# Patient Record
Sex: Male | Born: 2000 | Race: Black or African American | Hispanic: No | Marital: Single | Smoking: Former smoker
Health system: Southern US, Community
[De-identification: ages and names within clinical notes are randomized; demographics above are authoritative.]

## PROBLEM LIST (undated history)

## (undated) DIAGNOSIS — I10 Essential (primary) hypertension: Secondary | ICD-10-CM

## (undated) DIAGNOSIS — K219 Gastro-esophageal reflux disease without esophagitis: Secondary | ICD-10-CM

## (undated) HISTORY — PX: EYE SURGERY: SHX253

---
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
## 2018-10-10 ENCOUNTER — Emergency Department (HOSPITAL_COMMUNITY): Payer: Medicaid Other

## 2018-10-10 ENCOUNTER — Emergency Department (HOSPITAL_COMMUNITY)
Admission: EM | Admit: 2018-10-10 | Discharge: 2018-10-14 | Disposition: A | Discharge status: Other Acute Inpatient Hospital | Payer: Medicaid Other | Attending: Emergency Medicine | Admitting: Emergency Medicine

## 2018-10-10 ENCOUNTER — Encounter (HOSPITAL_COMMUNITY): Payer: Self-pay | Admitting: Emergency Medicine

## 2018-10-10 DIAGNOSIS — F23 Brief psychotic disorder: Secondary | ICD-10-CM | POA: Diagnosis not present

## 2018-10-10 DIAGNOSIS — Z046 Encounter for general psychiatric examination, requested by authority: Secondary | ICD-10-CM | POA: Insufficient documentation

## 2018-10-10 DIAGNOSIS — R4182 Altered mental status, unspecified: Secondary | ICD-10-CM | POA: Diagnosis present

## 2018-10-10 DIAGNOSIS — F323 Major depressive disorder, single episode, severe with psychotic features: Secondary | ICD-10-CM | POA: Diagnosis not present

## 2018-10-10 DIAGNOSIS — I10 Essential (primary) hypertension: Secondary | ICD-10-CM | POA: Insufficient documentation

## 2018-10-10 HISTORY — DX: Gastro-esophageal reflux disease without esophagitis: K21.9

## 2018-10-10 HISTORY — DX: Essential (primary) hypertension: I10

## 2018-10-10 LAB — CBC WITH DIFFERENTIAL/PLATELET
Abs Immature Granulocytes: 0.04 10*3/uL (ref 0.00–0.07)
Basophils Absolute: 0.1 10*3/uL (ref 0.0–0.1)
Basophils Relative: 1 %
Eosinophils Absolute: 0 10*3/uL (ref 0.0–0.5)
Eosinophils Relative: 0 %
HCT: 40.2 % (ref 39.0–52.0)
Hemoglobin: 12.9 g/dL — ABNORMAL LOW (ref 13.0–17.0)
Immature Granulocytes: 0 %
LYMPHS PCT: 17 %
Lymphs Abs: 1.9 10*3/uL (ref 0.7–4.0)
MCH: 29.9 pg (ref 26.0–34.0)
MCHC: 32.1 g/dL (ref 30.0–36.0)
MCV: 93.1 fL (ref 80.0–100.0)
Monocytes Absolute: 1 10*3/uL (ref 0.1–1.0)
Monocytes Relative: 9 %
NEUTROS PCT: 73 %
Neutro Abs: 8.1 10*3/uL — ABNORMAL HIGH (ref 1.7–7.7)
Platelets: 276 10*3/uL (ref 150–400)
RBC: 4.32 MIL/uL (ref 4.22–5.81)
RDW: 12.3 % (ref 11.5–15.5)
WBC: 11.1 10*3/uL — ABNORMAL HIGH (ref 4.0–10.5)
nRBC: 0 % (ref 0.0–0.2)

## 2018-10-10 LAB — COMPREHENSIVE METABOLIC PANEL
ALT: 20 U/L (ref 0–44)
AST: 26 U/L (ref 15–41)
Albumin: 4 g/dL (ref 3.5–5.0)
Alkaline Phosphatase: 48 U/L (ref 38–126)
Anion gap: 11 (ref 5–15)
BUN: 5 mg/dL — ABNORMAL LOW (ref 6–20)
CO2: 25 mmol/L (ref 22–32)
Calcium: 9.5 mg/dL (ref 8.9–10.3)
Chloride: 103 mmol/L (ref 98–111)
Creatinine, Ser: 0.82 mg/dL (ref 0.61–1.24)
GFR calc non Af Amer: >60 mL/min (ref >60)
Glucose, Bld: 98 mg/dL (ref 70–99)
Potassium: 3.8 mmol/L (ref 3.5–5.1)
Sodium: 139 mmol/L (ref 135–145)
Total Bilirubin: 0.8 mg/dL (ref 0.3–1.2)
Total Protein: 6.5 g/dL (ref 6.5–8.1)

## 2018-10-10 LAB — POCT I-STAT EG7
Acid-Base Excess: 3 mmol/L — ABNORMAL HIGH (ref 0.0–2.0)
Bicarbonate: 28.6 mmol/L — ABNORMAL HIGH (ref 20.0–28.0)
Calcium, Ion: 1.29 mmol/L (ref 1.15–1.40)
HCT: 39 % (ref 39.0–52.0)
HEMOGLOBIN: 13.3 g/dL (ref 13.0–17.0)
O2 Saturation: 75 %
Potassium: 3.7 mmol/L (ref 3.5–5.1)
Sodium: 139 mmol/L (ref 135–145)
TCO2: 30 mmol/L (ref 22–32)
pCO2, Ven: 48.7 mmHg (ref 44.0–60.0)
pH, Ven: 7.377 (ref 7.250–7.430)
pO2, Ven: 42 mmHg (ref 32.0–45.0)

## 2018-10-10 LAB — RAPID URINE DRUG SCREEN, HOSP PERFORMED
Amphetamines: NOT DETECTED
Barbiturates: NOT DETECTED
Benzodiazepines: NOT DETECTED
Cocaine: NOT DETECTED
Opiates: NOT DETECTED
Tetrahydrocannabinol: NOT DETECTED

## 2018-10-10 LAB — MAGNESIUM: MAGNESIUM: 1.8 mg/dL (ref 1.7–2.4)

## 2018-10-10 LAB — URINALYSIS, COMPLETE (UACMP) WITH MICROSCOPIC
Bacteria, UA: NONE SEEN
Bilirubin Urine: NEGATIVE
Glucose, UA: NEGATIVE mg/dL
Hgb urine dipstick: NEGATIVE
KETONES UR: NEGATIVE mg/dL
Leukocytes,Ua: NEGATIVE
Nitrite: NEGATIVE
Protein, ur: NEGATIVE mg/dL
Specific Gravity, Urine: 1.009 (ref 1.005–1.030)
pH: 7 (ref 5.0–8.0)

## 2018-10-10 LAB — ETHANOL: Alcohol, Ethyl (B): <10 mg/dL (ref <10)

## 2018-10-10 LAB — CBG MONITORING, ED: Glucose-Capillary: 110 mg/dL — ABNORMAL HIGH (ref 70–99)

## 2018-10-10 LAB — I-STAT CREATININE, ED: Creatinine, Ser: 0.8 mg/dL (ref 0.61–1.24)

## 2018-10-10 MED ORDER — LORAZEPAM 2 MG/ML IJ SOLN
4.0000 mg | INTRAMUSCULAR | Status: DC | PRN
  Administered 2018-10-12: 4 mg via INTRAVENOUS
  Filled 2018-10-10 (×4): qty 2

## 2018-10-10 MED ORDER — HALOPERIDOL LACTATE 5 MG/ML IJ SOLN
5.0000 mg | Freq: Once | INTRAMUSCULAR | Status: AC
  Administered 2018-10-10: 5 mg via INTRAMUSCULAR

## 2018-10-10 MED ORDER — LACTATED RINGERS IV BOLUS
1000.0000 mL | Freq: Once | INTRAVENOUS | Status: AC
  Administered 2018-10-10: 1000 mL via INTRAVENOUS

## 2018-10-10 MED ORDER — ONDANSETRON HCL 4 MG PO TABS: 4.0000 mg | ORAL_TABLET | Freq: Three times a day (TID) | ORAL | Status: DC | PRN

## 2018-10-10 MED ORDER — HALOPERIDOL LACTATE 5 MG/ML IJ SOLN
INTRAMUSCULAR | Status: AC
  Administered 2018-10-10: 5 mg via INTRAMUSCULAR
  Filled 2018-10-10: qty 1

## 2018-10-10 MED ORDER — IBUPROFEN 400 MG PO TABS: 600.0000 mg | ORAL_TABLET | Freq: Three times a day (TID) | ORAL | Status: DC | PRN

## 2018-10-10 MED ORDER — NICOTINE 21 MG/24HR TD PT24: 21.0000 mg | MEDICATED_PATCH | Freq: Every day | TRANSDERMAL | Status: DC

## 2018-10-10 MED ORDER — ZOLPIDEM TARTRATE 5 MG PO TABS: 5.0000 mg | ORAL_TABLET | Freq: Every evening | ORAL | Status: DC | PRN

## 2018-10-10 MED ORDER — DIPHENHYDRAMINE HCL 50 MG/ML IJ SOLN
50.0000 mg | Freq: Once | INTRAMUSCULAR | Status: AC
  Administered 2018-10-10: 50 mg via INTRAMUSCULAR
  Filled 2018-10-10: qty 1

## 2018-10-10 MED ORDER — LORAZEPAM 1 MG PO TABS
1.0000 mg | ORAL_TABLET | ORAL | Status: AC | PRN
  Administered 2018-10-12: 1 mg via ORAL
  Filled 2018-10-10 (×3): qty 1

## 2018-10-10 MED ORDER — ALUM & MAG HYDROXIDE-SIMETH 200-200-20 MG/5ML PO SUSP: 30.0000 mL | Freq: Four times a day (QID) | ORAL | Status: DC | PRN

## 2018-10-10 MED ORDER — OLANZAPINE 5 MG PO TBDP
5.0000 mg | ORAL_TABLET | Freq: Three times a day (TID) | ORAL | Status: DC | PRN
  Filled 2018-10-10 (×2): qty 1

## 2018-10-10 MED ORDER — LORAZEPAM 2 MG/ML IJ SOLN
2.0000 mg | Freq: Once | INTRAMUSCULAR | Status: AC
  Administered 2018-10-10: 2 mg via INTRAMUSCULAR
  Filled 2018-10-10: qty 1

## 2018-10-10 NOTE — ED Notes (Signed)
Return from CT.  Pt resting quietly at this time  Family at bedside

## 2018-10-10 NOTE — ED Notes (Signed)
Pt to CT at this time.

## 2018-10-10 NOTE — ED Provider Notes (Signed)
[facility] EMERGENCY DEPARTMENT Provider Note   CSN: [id number] Arrival date & time: 10/10/18  1450  LEVEL 5 CAVEAT - ALTERED MENTAL STATUS   History   Chief Complaint Chief Complaint  Patient presents with  . Aggressive Behavior    HPI Jon Branch is a 18 y.o. male.     HPI  18 year old male brought in by parents after shaking and altered mental status. History is limited by patient's altered mental status and language barrier, friend at bedside translates for parents.  The patient is overall healthy though taking unknown medicine for high blood pressure.  This was recently increased.  The patient has been working at a job but in the last 1 month he has been more withdrawn and quit his job.  Today, he went to the bathroom and he when he came back out, he all of a sudden stopped and then fell onto the couch.  He did not injure himself.  He then had shaking in his lower extremities and would not answer when called.  Unclear exactly how long the shaking lasted but is gone now.  As he is being brought back here, he is crying and then becomes agitated.  He was given IM Haldol prior to me seeing him.  No reports of seizures or psychiatric disease otherwise.  Mom states he felt hot after he came out of the bathroom but no known fever otherwise.   Past Medical History:  Diagnosis Date  . Hypertension     There are no active problems to display for this patient.         Home Medications    Prior to Admission medications   Not on File    Family History No family history on file.  Social History Social History   Tobacco Use  . Smoking status: Not on file  Substance Use Topics  . Alcohol use: Not on file  . Drug use: Not on file     Allergies   Patient has no known allergies.   Review of Systems Review of Systems  Unable to perform ROS: Mental status change     Physical Exam Updated Vital Signs BP 129/77   Pulse (!) 113   Temp 98.2 F  (36.8 C) (Rectal)   Resp (!) 25   SpO2 100%   Physical Exam Vitals signs and nursing note reviewed.  Constitutional:      Appearance: He is well-developed.  HENT:     Head: Normocephalic and atraumatic.     Right Ear: External ear normal.     Left Ear: External ear normal.     Nose: Nose normal.  Eyes:     General:        Right eye: No discharge.        Left eye: No discharge.     Pupils: Pupils are equal, round, and reactive to light.  Neck:     Musculoskeletal: Normal range of motion and neck supple. No neck rigidity.  Cardiovascular:     Rate and Rhythm: Regular rhythm. Tachycardia present.     Heart sounds: Normal heart sounds.  Pulmonary:     Effort: Pulmonary effort is normal.     Breath sounds: Normal breath sounds.  Abdominal:     General: There is no distension.     Palpations: Abdomen is soft.     Tenderness: There is no abdominal tenderness.  Skin:    General: Skin is warm and dry.  Neurological:  Mental Status: He is alert.  Psychiatric:        Mood and Affect: Mood is not anxious.        Behavior: Behavior is agitated and aggressive.      ED Treatments / Results  Labs (all labs ordered are listed, but only abnormal results are displayed) Labs Reviewed  CBC WITH DIFFERENTIAL/PLATELET - Abnormal; Notable for the following components:      Result Value   WBC 11.1 (*)    Hemoglobin 12.9 (*)    Neutro Abs 8.1 (*)    All other components within normal limits  COMPREHENSIVE METABOLIC PANEL - Abnormal; Notable for the following components:   BUN 5 (*)    All other components within normal limits  CBG MONITORING, ED - Abnormal; Notable for the following components:   Glucose-Capillary 110 (*)    All other components within normal limits  POCT I-STAT EG7 - Abnormal; Notable for the following components:   Bicarbonate 28.6 (*)    Acid-Base Excess 3.0 (*)    All other components within normal limits  MAGNESIUM  ETHANOL  RAPID URINE DRUG SCREEN, HOSP  PERFORMED  URINALYSIS, COMPLETE (UACMP) WITH MICROSCOPIC  I-STAT CREATININE, ED    EKG EKG Interpretation  Date/Time:  Friday October 10 2018 15:28:08 EST Ventricular Rate:  97 PR Interval:    QRS Duration: 83 QT Interval:  343 QTC Calculation: 436 R Axis:   76 Text Interpretation:  Normal sinus rhythm Biatrial enlargement RSR' in V1 or V2, probably normal variant Left ventricular hypertrophy ST elev, probable normal early repol pattern No old tracing to compare Confirmed by Pricilla LovelessGoldston, Namiah Dunnavant [id number]) on 10/10/2018 3:31:47 PM   Radiology Ct Head Wo Contrast  Result Date: 10/10/2018 CLINICAL DATA:  Altered mental status.  Seizure, new, nontraumatic EXAM: CT HEAD WITHOUT CONTRAST TECHNIQUE: Contiguous axial images were obtained from the base of the skull through the vertex without intravenous contrast. COMPARISON:  No priors are available. FINDINGS: Brain: No evidence for acute infarction, hemorrhage, mass lesion, hydrocephalus, or extra-axial fluid. Normal cerebral volume. Cavum septum pellucidum et vergae, normal variant. Symmetric temporal horns. Vascular: No hyperdense vessel or unexpected calcification. Skull: Normal. Negative for fracture or focal lesion. Sinuses/Orbits: No acute finding. Other: None. IMPRESSION: Negative exam. Electronically Signed   By: Elsie StainJohn T Curnes M.D.   On: 10/10/2018 16:36   Dg Chest Port 1 View  Result Date: 10/10/2018 CLINICAL DATA:  Possible seizure. EXAM: PORTABLE CHEST 1 VIEW COMPARISON:  None. FINDINGS: 1517 hours. The lungs are clear without focal pneumonia, edema, pneumothorax or pleural effusion. The cardiopericardial silhouette is within normal limits for size. The visualized bony structures of the thorax are intact. Telemetry leads overlie the chest. Extreme left costophrenic sulcus has not been included on the film. IMPRESSION: No active disease. Electronically Signed   By: Kennith CenterEric  Mansell M.D.   On: 10/10/2018 17:55    Procedures .Critical  Care Performed by: Pricilla LovelessGoldston, Raquel Racey, MD Authorized by: Pricilla LovelessGoldston, Steele Ledonne, MD   Critical care provider statement:    Critical care time (minutes):  30   Critical care time was exclusive of:  Separately billable procedures and treating other patients   Critical care was necessary to treat or prevent imminent or life-threatening deterioration of the following conditions:  CNS failure or compromise   Critical care was time spent personally by me on the following activities:  Development of treatment plan with patient or surrogate, evaluation of patient's response to treatment, examination of patient, obtaining history from patient or  surrogate, ordering and performing treatments and interventions, ordering and review of laboratory studies, ordering and review of radiographic studies, pulse oximetry and re-evaluation of patient's condition   (including critical care time)  Medications Ordered in ED Medications  LORazepam (ATIVAN) injection 4 mg (has no administration in time range)  OLANZapine zydis (ZYPREXA) disintegrating tablet 5 mg (has no administration in time range)    And  LORazepam (ATIVAN) tablet 1 mg (has no administration in time range)  ibuprofen (ADVIL,MOTRIN) tablet 600 mg (has no administration in time range)  ondansetron (ZOFRAN) tablet 4 mg (has no administration in time range)  alum & mag hydroxide-simeth (MAALOX/MYLANTA) 200-200-20 MG/5ML suspension 30 mL (has no administration in time range)  nicotine (NICODERM CQ - dosed in mg/24 hours) patch 21 mg (has no administration in time range)  zolpidem (AMBIEN) tablet 5 mg (has no administration in time range)  LORazepam (ATIVAN) injection 2 mg (2 mg Intramuscular Given by Other 10/10/18 1516)  lactated ringers bolus 1,000 mL (0 mLs Intravenous Stopped 10/10/18 1734)  haloperidol lactate (HALDOL) injection 5 mg (5 mg Intramuscular Given by Other 10/10/18 1455)  diphenhydrAMINE (BENADRYL) injection 50 mg (50 mg Intramuscular Given  10/10/18 1528)  lactated ringers bolus 1,000 mL (0 mLs Intravenous Stopped 10/10/18 2000)     Initial Impression / Assessment and Plan / ED Course  I have reviewed the triage vital signs and the nursing notes.  Pertinent labs & imaging results that were available during my care of the patient were reviewed by me and considered in my medical decision making (see chart for details).        There is initial concern for seizure/postictal state.  However there are also things pointing towards psychiatric illness, especially the withdrawal from normal activities and quitting his job over the last 1 month.  He seemed to be a lot calmer and so security let off of him as they were previously holding him down after IM treatments.  However then he started to help them take off his pants and started to masturbate.  He was smiling during this time.  His work-up is otherwise negative.  His rectal temperature is normal.  While he was initially tachycardic while agitated this has improved.  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  He does seem calmer and will need psychiatric admission.  Psych is currently looking for inpatient placement.  Highly doubt acute CNS infection.  Final Clinical Impressions(s) / ED Diagnoses   Final diagnoses:  Acute psychosis [facility])    ED Discharge Orders    None       Pricilla Loveless, MD 10/11/18 0111

## 2018-10-10 NOTE — [facility] Assessment (Signed)
[facility] Assessment Progress Note   TTS attempted to see patient, but he was sedated with ativan and haldol according to his nurse, Selena Batten, and cannot be assessed at this time.

## 2018-10-10 NOTE — ED Notes (Signed)
Pt appears calm at this time, Turned pt over, pt immediately started smiling, removing his pants and starts masturbating.  Dr. Criss Alvine made aware. Order received for same.

## 2018-10-10 NOTE — ED Triage Notes (Signed)
Pt from waiting room having behavior where he was "all over the place" family with him endorses possible seizure. Also become increasingly withdrawn lately and quit job. Pt is aggressive and having to be restrained by nursing staff and security.

## 2018-10-10 NOTE — ED Notes (Signed)
TTS in process 

## 2018-10-10 NOTE — ED Notes (Signed)
Pt resting quietly at this time with eyes closed 

## 2018-10-11 ENCOUNTER — Encounter (HOSPITAL_COMMUNITY): Payer: Self-pay | Admitting: *Deleted

## 2018-10-11 ENCOUNTER — Other Ambulatory Visit: Payer: Self-pay

## 2018-10-11 MED ORDER — STERILE WATER FOR INJECTION IJ SOLN
INTRAMUSCULAR | Status: AC
  Administered 2018-10-11: 2.1 mL
  Filled 2018-10-11: qty 10

## 2018-10-11 MED ORDER — HALOPERIDOL 5 MG PO TABS
5.0000 mg | ORAL_TABLET | Freq: Two times a day (BID) | ORAL | Status: DC
  Administered 2018-10-12 – 2018-10-14 (×4): 5 mg via ORAL
  Filled 2018-10-11 (×5): qty 1

## 2018-10-11 MED ORDER — ZIPRASIDONE MESYLATE 20 MG IM SOLR
10.0000 mg | Freq: Once | INTRAMUSCULAR | Status: AC
  Administered 2018-10-11: 10 mg via INTRAMUSCULAR
  Filled 2018-10-11: qty 20

## 2018-10-11 MED ORDER — HYDROCHLOROTHIAZIDE 12.5 MG PO CAPS
12.5000 mg | ORAL_CAPSULE | Freq: Every day | ORAL | Status: DC
  Administered 2018-10-12 – 2018-10-14 (×3): 12.5 mg via ORAL
  Filled 2018-10-11 (×3): qty 1

## 2018-10-11 MED ORDER — BENZTROPINE MESYLATE 1 MG/ML IJ SOLN
0.5000 mg | Freq: Two times a day (BID) | INTRAMUSCULAR | Status: DC
  Administered 2018-10-12: 0.5 mg via INTRAMUSCULAR
  Filled 2018-10-11 (×3): qty 0.5

## 2018-10-11 MED ORDER — PANTOPRAZOLE SODIUM 40 MG PO TBEC
40.0000 mg | DELAYED_RELEASE_TABLET | Freq: Every day | ORAL | Status: DC
  Administered 2018-10-12 – 2018-10-14 (×3): 40 mg via ORAL
  Filled 2018-10-11 (×3): qty 1

## 2018-10-11 MED ORDER — LISINOPRIL 20 MG PO TABS
20.0000 mg | ORAL_TABLET | Freq: Every day | ORAL | Status: DC
  Administered 2018-10-12 – 2018-10-14 (×3): 20 mg via ORAL
  Filled 2018-10-11 (×3): qty 1

## 2018-10-11 MED ORDER — STERILE WATER FOR INJECTION IJ SOLN
INTRAMUSCULAR | Status: AC
  Administered 2018-10-11: 1.2 mL
  Filled 2018-10-11: qty 10

## 2018-10-11 MED ORDER — LISINOPRIL-HYDROCHLOROTHIAZIDE 20-12.5 MG PO TABS: 1.0000 | ORAL_TABLET | Freq: Every day | ORAL | Status: DC

## 2018-10-11 NOTE — ED Notes (Signed)
Pt on phone at nurses' desk talking w/his father. 

## 2018-10-11 NOTE — ED Notes (Addendum)
Pt called his mother from phone at nurses' desk w/assistance from his father's friend. Pt has returned to room w/his visitors x 2.

## 2018-10-11 NOTE — ED Notes (Signed)
Pt awake - ambulated to bathroom and back to room.

## 2018-10-11 NOTE — ED Provider Notes (Signed)
Patient tried to leave and run away.  He is involuntarily committed.  Security staff had to hold him down and bring him to his room.  Now, he is on the floor praying.  We will try to give his oral meds but if this does not work he will be given IM Geodon.   Pricilla Loveless, MD 10/11/18 2039
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
## 2018-10-11 NOTE — ED Notes (Signed)
Patient was given a snack and drink. A Regular Diet was ordered for Lunch. 
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
## 2018-10-11 NOTE — ED Notes (Signed)
IVC papers served - copy faxed to [facility], copy sent to Medical Records, original placed in folder for Magistrate, and all 3 sets on clipboard.  
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
## 2018-10-11 NOTE — ED Notes (Signed)
RN attempted to call patient's mother condition but there was a language barrier with the person who answered called-[name],RN

## 2018-10-11 NOTE — ED Notes (Signed)
Pt's family members x 2 males at bedside.

## 2018-10-11 NOTE — Progress Notes (Signed)
Patient meets criteria for inpatient treatment. No appropriate or available beds at [facility]. CSW faxed referrals to the following facilities for review:  [facility]  [facility]  [facility]  [facility]  [facility]  [facility]  [facility]  [facility]  [facility]  [facility]  [facility]  [facility]  [facility]  [facility]  [facility]  [facility]  [facility]  [facility]  [facility]  [facility]  [facility]  [facility]  [facility]   TTS will continue to seek bed placement.  Vilma Meckel. Algis Greenhouse, MSW, LCSW Clinical Social Work/Disposition Phone: [phone] Fax: [phone]

## 2018-10-11 NOTE — ED Notes (Signed)
Re-TTS being performed.  

## 2018-10-11 NOTE — ED Notes (Signed)
RN asked patient if he would take oral mediation so Shot will not have been given; Patient lying in bed stating he does not know where is he is or why he is here; pt states he is not sick and does not need pills; pt is visibly anxious; pt decline pills several times so RN administered shot;Dr. Criss [place]vine notified-[name],RN

## 2018-10-11 NOTE — ED Notes (Signed)
Father reported pt has not been sleeping and has been under a lot of stress. Voiced agreement w/tx plan for pt to stay overnight and be reassessed in AM. Pt also voiced agreement. Pt's father brought pt's meds - Omeprazole 40mg  daily and Lisinopril/HCTZ 20/12.5mg  daily - advised Adline Mango Tech. Meds sent back home w/father. Pt's shoes placed labeled belongings bag in [place] [address] w/pt's other belongings.

## 2018-10-11 NOTE — ED Notes (Signed)
Patient started asking for his clothes; Sitter and RN attempted to redirect patient back to room; patient has now being standing outside door for about 10 minutes asking for his clothes-[name],RN

## 2018-10-11 NOTE — Consult Note (Addendum)
Emergency room staff contacted [facility] for a reevaluation via  teleassessment due to aggressive behavior and thought blocking. NP attempt to reassess. Security and staff at bedside. Patient staring at monitor with no responses to questions, No participation during this assessment. Chart reviewed. [facility] to be recommended for inpatient admission.     -NP consulted with attending psychiatrist for medication recommendation.  EKG-436 QTC.  MD recommends initiating Haldol 5 mg p.o. twice daily and Cogentin 0.5 mg p.o. twice daily for mood stabilization.

## 2018-10-11 NOTE — ED Notes (Addendum)
Pt stood at nurses' desk - talking w/Staff and Security. Pt refusing to go back into his room as instructed. Security escorted pt to room. Pt appears to be responding to internal stimuli - staring at staff then smiling inappropriately. Pt denies AH/VH. Pt noted to be non-verbal. Refusing po meds. Staff attempted to re-direct pt - unable. Dr Fredderick Phenix aware - order received for Geodon - given - Pt tolerated well. Alcario Drought, NP, [facility], attempted to Telepsych pt as per Dr Christoper Fabian request - recommended for pt to be IVC'd and will seek Inpt Tx. Dr Fredderick Phenix aware.

## 2018-10-11 NOTE — ED Notes (Signed)
Pt on phone at nurses' desk. 

## 2018-10-11 NOTE — ED Notes (Signed)
Called [facility], as per [employer], to request for [employer] to pick up IVC papers from [employer] and come serve d/t fax not working well.

## 2018-10-11 NOTE — BH Assessment (Signed)
[facility] Assessment Progress Note    TTS Reassessment:  Patient was seen for reassessment.  He was cooperative, alert and oriented.  Patient states that he has little memory of what happened yesterday that caused him to be so out of control and having to be brought to the hospital.  Patient admits to being depressed, but denies SI/HI/Psychosis.  Patient states that he has not been treated for mental illness in the past.  Patient denies any current drug or alcohol use.  TTS staffed case with [facility] Provider, Reola Calkins, NP, who felt like patient needed to be monitored for safety for another day to determine if patient is having a psychotic break.  He was given Haldol last night which appeared to clear him for the moment, but he would benefit from some additional monitoring.

## 2018-10-11 NOTE — ED Provider Notes (Signed)
Patient is becoming more agitated.  He is staring off into space and is noncommunicative.  He is pacing back and forth at times.  IVC papers were undertaken and a reassessment was done by psychiatry who recommends inpatient treatment.  They have made some medication recommendations which I have ordered.   Rolan Bucco, MD 10/11/18 [phone]

## 2018-10-11 NOTE — ED Notes (Signed)
Pt ambulated to bathroom and back to room w/o difficulty.  

## 2018-10-12 MED ORDER — BENZTROPINE MESYLATE 1 MG PO TABS
0.5000 mg | ORAL_TABLET | Freq: Two times a day (BID) | ORAL | Status: DC
  Administered 2018-10-12 – 2018-10-14 (×3): 0.5 mg via ORAL
  Filled 2018-10-12 (×3): qty 1

## 2018-10-12 MED ORDER — HALOPERIDOL LACTATE 5 MG/ML IJ SOLN
5.0000 mg | Freq: Once | INTRAMUSCULAR | Status: AC
  Administered 2018-10-12: 5 mg via INTRAMUSCULAR
  Filled 2018-10-12: qty 1

## 2018-10-12 MED ORDER — STERILE WATER FOR INJECTION IJ SOLN
INTRAMUSCULAR | Status: AC
  Filled 2018-10-12: qty 10

## 2018-10-12 MED ORDER — ZIPRASIDONE MESYLATE 20 MG IM SOLR
20.0000 mg | Freq: Once | INTRAMUSCULAR | Status: AC
  Administered 2018-10-12: 20 mg via INTRAMUSCULAR
  Filled 2018-10-12: qty 20

## 2018-10-12 MED ORDER — DIPHENHYDRAMINE HCL 50 MG/ML IJ SOLN
50.0000 mg | Freq: Once | INTRAMUSCULAR | Status: AC
  Administered 2018-10-12: 50 mg via INTRAMUSCULAR
  Filled 2018-10-12: qty 1

## 2018-10-12 NOTE — ED Notes (Signed)
Pt ambulatory to nurses' desk stating his name then states "I want to see my parents" when asked pt what he needed. Pt then returned to room.

## 2018-10-12 NOTE — ED Notes (Signed)
Pt states he wants to ask his parents why they brought him to ED d/t he does not remember. Also reports he does not recall attempting to leave ED last evening. Pt noted to be calm, cooperative at this time.

## 2018-10-12 NOTE — ED Notes (Signed)
Patient awake asking for water and has gone to the bathroom; When patient returns to room he starts asking Sitter to call his parents; RN advised patient he can call parents in the morning; pt got aggressive stanse and demanded to use the phone; pt walked out of room to the RN station attempting to grab the phone; pt was told several times he was not able to make phone call this time of the morning; RN attempted to call 3 different numbers given by patient with either no answer or going straight to v/m; RN advised patient but patient was still insisting on calling parents; Security in unit; Pt had to have security escort him back to room; pt stood in door way and would not sit down;EDP notified and [facility] NP called for new orders-[name],RN

## 2018-10-12 NOTE — ED Notes (Signed)
Dinner tray ordered.

## 2018-10-12 NOTE — Progress Notes (Addendum)
CSW contacted referral facilities with the following results:  Still reviewing:  [facility]  [facility]  [facility]  [facility]  [facility]  [facility]  [facility]  [facility]  [facility]  [facility]  [facility]  [facility]  [facility]   Declined:  [facility]  [facility]  [facility]  [facility]  [facility]  [facility]  [facility]  [facility]  [facility]  [facility]   TTS will continue to seek placement.  Vilma Meckel. Algis Greenhouse, MSW, LCSW Clinical Social Work/Disposition Phone: [phone] Fax: [phone]

## 2018-10-12 NOTE — BH Assessment (Addendum)
TTS Reassessment:  Patient presents lying in bed dressed in scrubs. Pt is pleasant, but offers little content with his answers. Pt appeared calm with confused expression as he softly stated "I don't know" repeatedly. Pt responded he did not know: why he was in the hospital, if he is suicidal, does he want to die, what high school he attended & when he moved to the [place]. Pt also stated he 'didn't remember' often. Pt stated he didn't understand term 'Depression'. After it was described to him, pt stated yes, he does have Depression sometimes. Pt also stated yes to he doesn't want to live. No tox screen available at this time. TTS/Social Work will continue to seek placement for patient due to her continued suicidal ideation.

## 2018-10-12 NOTE — ED Notes (Signed)
Pt sitting in floor praying.
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
## 2018-10-12 NOTE — ED Notes (Signed)
Father and friend visited w/pt.

## 2018-10-12 NOTE — ED Notes (Addendum)
Patient is out the room again asking/demanding to call his parents; Pt has been asked to return to his room but will not; pt is currently standing at RN station just staring staff; RN has stopped responding to the patient's repeated questions; Pt peering over RN desk for about 5 minutes and then headed for the door; Sitter followed patient to end to CT hallway and patient was stopped by Security and brought back in unit; Charge and EDP notified-[name],RN

## 2018-10-12 NOTE — ED Notes (Signed)
Lunch tray ordered 

## 2018-10-12 NOTE — ED Provider Notes (Addendum)
2:00 AM  Called to bedside by nurse.  Patient agitated and unable to be redirected.  He is under involuntary commitment.  Sedation medications have been ordered by Arvil Persons.  Nurse requesting restraints.  Police and security at bedside.  Restraints have been ordered.   Ward, Layla Maw, DO 10/12/18 0206   6:00 AM  Pt continues to be very agitated and difficult to redirect.  It appears he was given to 10 mg of Geodon IM last night and then 5 mg of IM Haldol this morning.  Nurse states that this only helps him for several hours and then he gets up and is agitated again.  We will try 20 mg of IM Geodon now.  If this does not help we will give 2 mg of IM Ativan.   Ward, Layla Maw, DO 10/12/18 [phone]

## 2018-10-12 NOTE — ED Notes (Addendum)
Pt's parents visited. Advised pt began experiencing issues after his Lisinopril/HCTZ dosage was increased from 10 to 20/12.5mg . Parents aware pt is under IVC and seeking Inpt Placement.

## 2018-10-12 NOTE — ED Notes (Addendum)
Pt sat in floor and prayed - now lying on bed w/eyes closed. Respirations even, unlabored.

## 2018-10-13 MED ORDER — ZIPRASIDONE MESYLATE 20 MG IM SOLR
20.0000 mg | Freq: Once | INTRAMUSCULAR | Status: AC
  Administered 2018-10-13: 20 mg via INTRAMUSCULAR
  Filled 2018-10-13: qty 20

## 2018-10-13 MED ORDER — DIPHENHYDRAMINE HCL 25 MG PO CAPS
25.0000 mg | ORAL_CAPSULE | Freq: Once | ORAL | Status: AC
  Administered 2018-10-13: 25 mg via ORAL
  Filled 2018-10-13: qty 1

## 2018-10-13 MED ORDER — LORAZEPAM 1 MG PO TABS
1.0000 mg | ORAL_TABLET | ORAL | Status: AC | PRN
  Administered 2018-10-13: 1 mg via ORAL
  Filled 2018-10-13: qty 1

## 2018-10-13 MED ORDER — STERILE WATER FOR INJECTION IJ SOLN
INTRAMUSCULAR | Status: AC
  Administered 2018-10-13: 17:00:00
  Filled 2018-10-13: qty 10

## 2018-10-13 MED ORDER — LORAZEPAM 1 MG PO TABS
1.0000 mg | ORAL_TABLET | ORAL | Status: DC | PRN
  Administered 2018-10-13 – 2018-10-14 (×2): 1 mg via ORAL
  Filled 2018-10-13 (×2): qty 1

## 2018-10-13 NOTE — ED Notes (Signed)
Pt wanting to leave, explained that he meets inpatient criteria for behavioral health treatment and he is going to remain here at this time. Pt calm and cooperative throughout conversation and voices understanding.

## 2018-10-13 NOTE — ED Notes (Signed)
PA informed of elevated HR, denies chest pain, will continue to monitor.

## 2018-10-13 NOTE — ED Notes (Signed)
Ambulated to BR.

## 2018-10-13 NOTE — BH Assessment (Addendum)
TTS reassessed the pt.  The pt stated he is feeling better.  He knows that he was not doing well prior to coming to the hospital.  The pt is currently cooperative.  He reports having hallucinations prior to coming to the hospital.  When asked about the hallucinations, the pt stated, "it was a lot".  He denies having hallucinations currently.  He denies SI and HI.  He denies taking any mental health medication in the past. NP Hillery Jacks recommends the pt be inpatient.  SW continues to look for placement for the pt.

## 2018-10-13 NOTE — ED Notes (Signed)
RN informed it's ok for 2 visitors to come back

## 2018-10-13 NOTE — ED Notes (Signed)
Pt walked out of unit, escorted back to bed by sitters. Sat outside of room for several minutes. This RN and security negotiated with pt, who insisted he was fine. Calm but uncooperative, informed that restraints would have to be considered d/t pt's elopement risk and refusal to return to room. Called parents for pt as pt was requesting this and has not been able to get a hold of them today. Eventually returned to room with assistance from security, pt now in bed requesting Quaran. Chaplin paged.

## 2018-10-13 NOTE — Progress Notes (Signed)
   10/13/18 1000  Clinical Encounter Type  Visited With Patient;Health care provider  Visit Type Initial;Spiritual support;ED  Referral From Nurse  Spiritual Encounters  Spiritual Needs [facility] text;Emotional  Stress Factors  Patient Stress Factors Health changes   Pt had communicated desire for an Arabic Koran to RN.  Brought one from [employer] supply.    Margretta Sidle resident, [phone]

## 2018-10-13 NOTE — ED Notes (Addendum)
Pt's family visiting with pt. Father states concern that his eyes are "not good." No swelling or redness has been noted in pt's eyes, nor has pt complained of vision issues. This was reported to PA who recommended visual acuity test.  L eye 20/40, R eye 20/25. Very mild swelling seen on close examination, 25 benedryl ordered.

## 2018-10-13 NOTE — ED Notes (Signed)
Out of room again, calm but uncooperative about returning to room. Escorted back to room by sitter, security at bedside. Expressing bordem and desire to walk outside. Informed that this is not allowed, but security officer offered to walk around the halls with pt. Pt walked halls twice with Engineer, materials and returned to room.

## 2018-10-13 NOTE — ED Notes (Addendum)
Pt attempting to leave again, will not return to bed for nursing staff, escorted back by security. Willingly taking PRN po meds. Openly telling security he will not stay in room. Consulted Dr Judd Lien, order given for geodon, cooperative with IM injection and took willingly.

## 2018-10-13 NOTE — BH Assessment (Signed)
10/13/2018 Patient continues to meet criteria for inpatient treatment. Faxed re-faxed referrals to [facility], [place], [address], [place], [place], [facility], [facility], [facility], [place], [place], [place], and [place]. Pending review.

## 2018-10-14 ENCOUNTER — Encounter (HOSPITAL_COMMUNITY): Payer: Self-pay | Admitting: *Deleted

## 2018-10-14 ENCOUNTER — Other Ambulatory Visit: Payer: Self-pay | Admitting: Registered Nurse

## 2018-10-14 ENCOUNTER — Encounter (HOSPITAL_COMMUNITY): Payer: Self-pay | Admitting: Registered Nurse

## 2018-10-14 ENCOUNTER — Inpatient Hospital Stay (HOSPITAL_COMMUNITY)
Admission: AD | Admit: 2018-10-14 | Discharge: 2018-10-16 | DRG: 885 | Disposition: A | Discharge status: Home / Self Care | Payer: Medicaid Other | Source: Intra-hospital | Attending: Psychiatry | Admitting: Psychiatry

## 2018-10-14 ENCOUNTER — Other Ambulatory Visit: Payer: Self-pay

## 2018-10-14 DIAGNOSIS — F19959 Other psychoactive substance use, unspecified with psychoactive substance-induced psychotic disorder, unspecified: Secondary | ICD-10-CM | POA: Diagnosis present

## 2018-10-14 DIAGNOSIS — F23 Brief psychotic disorder: Secondary | ICD-10-CM | POA: Diagnosis not present

## 2018-10-14 DIAGNOSIS — F333 Major depressive disorder, recurrent, severe with psychotic symptoms: Secondary | ICD-10-CM | POA: Diagnosis present

## 2018-10-14 DIAGNOSIS — Z62810 Personal history of physical and sexual abuse in childhood: Secondary | ICD-10-CM | POA: Diagnosis present

## 2018-10-14 DIAGNOSIS — F1290 Cannabis use, unspecified, uncomplicated: Secondary | ICD-10-CM | POA: Diagnosis present

## 2018-10-14 DIAGNOSIS — F2081 Schizophreniform disorder: Principal | ICD-10-CM

## 2018-10-14 MED ORDER — ZIPRASIDONE MESYLATE 20 MG IM SOLR
20.0000 mg | Freq: Once | INTRAMUSCULAR | Status: AC
  Administered 2018-10-14: 20 mg via INTRAMUSCULAR
  Filled 2018-10-14: qty 20

## 2018-10-14 MED ORDER — TEMAZEPAM 15 MG PO CAPS
30.0000 mg | ORAL_CAPSULE | Freq: Every day | ORAL | Status: DC
  Filled 2018-10-14: qty 2

## 2018-10-14 MED ORDER — HALOPERIDOL 5 MG PO TABS: 5.0000 mg | ORAL_TABLET | Freq: Four times a day (QID) | ORAL | Status: DC | PRN

## 2018-10-14 MED ORDER — OMEGA-3-ACID ETHYL ESTERS 1 G PO CAPS
1.0000 g | ORAL_CAPSULE | Freq: Two times a day (BID) | ORAL | Status: DC
  Filled 2018-10-14 (×6): qty 1

## 2018-10-14 MED ORDER — RISPERIDONE 3 MG PO TABS
3.0000 mg | ORAL_TABLET | Freq: Two times a day (BID) | ORAL | Status: DC
  Filled 2018-10-14 (×6): qty 1

## 2018-10-14 MED ORDER — BENZTROPINE MESYLATE 0.5 MG PO TABS
0.5000 mg | ORAL_TABLET | Freq: Two times a day (BID) | ORAL | Status: DC
  Filled 2018-10-14 (×7): qty 1

## 2018-10-14 MED ORDER — PRENATAL MULTIVITAMIN CH
1.0000 | ORAL_TABLET | Freq: Every day | ORAL | Status: DC
  Filled 2018-10-14 (×2): qty 1

## 2018-10-14 MED ORDER — BENZTROPINE MESYLATE 1 MG PO TABS: 1.0000 mg | ORAL_TABLET | Freq: Two times a day (BID) | ORAL | Status: DC

## 2018-10-14 MED ORDER — ZIPRASIDONE MESYLATE 20 MG IM SOLR: 10.0000 mg | Freq: Three times a day (TID) | INTRAMUSCULAR | Status: DC | PRN

## 2018-10-14 MED ORDER — LORAZEPAM 2 MG/ML IJ SOLN: 2.0000 mg | INTRAMUSCULAR | Status: DC | PRN

## 2018-10-14 MED ORDER — LORAZEPAM 1 MG PO TABS: 2.0000 mg | ORAL_TABLET | ORAL | Status: DC | PRN

## 2018-10-14 MED ORDER — HALOPERIDOL LACTATE 5 MG/ML IJ SOLN: 10.0000 mg | Freq: Four times a day (QID) | INTRAMUSCULAR | Status: DC | PRN

## 2018-10-14 NOTE — Consult Note (Signed)
  Medication Recommendation  Jon Branch, 18 y.o., male patient  chart reviewed and discussed with Dr. Lucianne Muss on 10/14/18.  Current medications are Cogentin 0.5 mg Bid, Haldol 5 mg Bid Ativan 1 mg Q 4 hr prn anxiety.  Patient has also had Geodon 20 mg injection 10/13/18 for agitation.    Recommendation: continue Haldol to 5 mg Bid, Increase Cogentin 1 mg Bid.  EKG to rule out QT prolongation;  For agitation Ativan 2 mg / Geodon 10 mg Q 8 hr prn agitation.     Spoke to Dr. Ranae Palms; informed of above recommendations  Shuvon B. Rankin, NP

## 2018-10-14 NOTE — ED Provider Notes (Signed)
Patient currently under involuntary commitment awaiting placement.  He has been experiencing intermittent episodes of agitation requiring chemical sedation.  Patient now becoming agitated, coming out of his room and stating that he wants to leave.  Will re-dose Geodon.   Gilda Crease, MD 10/14/18 [phone]

## 2018-10-14 NOTE — ED Notes (Signed)
Regular Diet was ordered for Lunch. 
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
## 2018-10-14 NOTE — ED Notes (Signed)
Pt sitting in floor outside of room - refusing to go back into room after much encouragement from staff. Pt stated he wants to leave. Pt escorted to room by staff. Security standing by. Pt asked to call his mother - advised he may do so after shift change.

## 2018-10-14 NOTE — ED Notes (Signed)
Pt's parents visited w/friend. Asking if pt may leave - parents and pt aware pt is under IVC and placement is being sought.

## 2018-10-14 NOTE — ED Notes (Addendum)
Patient was given Cookies and Orange Juice.Marland Kitchen

## 2018-10-14 NOTE — Progress Notes (Signed)
D:  Jon Branch was up and visible on the unit later in the evening.  He did not come to evening wrap up group.  He adamantly denied SI/HI or A/V hallucinations.  He stated "I don't need to be here and I don't know why I am here."  He declined hs sleeping medication.  He denied any pain or discomfort and appeared to be in no physical distress.  He is currently resting with his eyes closed and appears to be asleep. A:  1:1 with RN for support and encouragement.  Offered medications as ordered.  Q 15 minute checks maintained for safety.  Encouraged participation in group and unit activities.   R:  Jon Branch remains safe on the unit.  We will continue to monitor the progress towards his goals.

## 2018-10-14 NOTE — B[facility] Suicide Risk Assessment (Signed)
[facility] Admission Suicide Risk Assessment   Nursing information obtained from:    Demographic factors:    Current Mental Status:    Loss Factors:    Historical Factors:    Risk Reduction Factors:     Total Time spent with patient: 45 minutes Principal Problem: new Onset psychosis probable schizophreniform disorder Diagnosis:  Active Problems:   MDD (major depressive disorder), recurrent, severe, with psychosis (HCC)   Schizophreniform disorder (HCC)  Subjective Data: New onset psychosis in the context of negative CT scan, negative drug screen but daily cannabis use until the last month or so  Continued Clinical Symptoms:  Alcohol Use Disorder Identification Test Final Score (AUDIT): 0 The "Alcohol Use Disorders Identification Test", Guidelines for Use in Primary Care, Second Edition.  World Science writer [facility]). Score between 0-7:  no or low risk or alcohol related problems. Score between 8-15:  moderate risk of alcohol related problems. Score between 16-19:  high risk of alcohol related problems. Score 20 or above:  warrants further diagnostic evaluation for alcohol dependence and treatment.   CLINICAL FACTORS:   Schizophrenia:   Less than 25 years old    COGNITIVE FEATURES THAT CONTRIBUTE TO RISK:  Loss of executive function    SUICIDE RISK:   Minimal: No identifiable suicidal ideation.  Patients presenting with no risk factors but with morbid ruminations; may be classified as minimal risk based on the severity of the depressive symptoms  PLAN OF CARE: see orders  I certify that inpatient services furnished can reasonably be expected to improve the patient's condition.   Malvin Johns, MD 10/14/2018, 3:31 PM

## 2018-10-14 NOTE — ED Notes (Signed)
Patient is aware of treatment plan to transfer to behavorial health. Patient belongings was given to father this morning.

## 2018-10-14 NOTE — ED Notes (Addendum)
Patient came out of room and started demanding to call his parents; pt then starts cursing at staff and states he can just walk out if he wants; Pt continues to states he wants to leave; pt standing at RN station refusing to go back to room with threatening stance; pt starts to aruge with sitter; Security and [employer] has arrived to unit and EDP notified for [facility]

## 2018-10-14 NOTE — Progress Notes (Signed)
Jon Branch is a 18 year old male pt admitted on involuntary basis. On admission, when asked why he was here he reports that he was unsure. When told about why he was here and having hallucinations in the past, he adamantly denied and reports that he never said this. He also denies SI currently and able to contract for safety while in the hospital but does report that he was feeling suicidal a couple of months ago but would not say further as to why and if he had a plan. He does endorse regular marijuana usage but denies any other substance abuse. He reports that he lives with his parents and his little sister and reports that he will return there once he is discharged. Mogtaba was escorted to the unit, oriented to the milieu and safety maintained.

## 2018-10-14 NOTE — ED Notes (Signed)
Pt called his mother from phone at nurses' desk.

## 2018-10-14 NOTE — Progress Notes (Signed)
Pt accepted to [facility], Bed 504-1  Nira Conn, NP is the accepting provider.  Malvin Johns, MD is the attending provider.  Call report to [phone]  [facility] Psych ED notified.   Pt is IVC PLEASE FAX IVC Pt may be transported by [facility] Pt scheduled  to arrive at [facility] between 14:00 and 14:30  Timmothy Euler. Kaylyn Lim, MSW, LCSWA Disposition Clinical Social Work [phone] (cell) [phone] (office)

## 2018-10-14 NOTE — Tx Team (Signed)
Initial Treatment Plan 10/14/2018 2:41 PM Mogtaba Kratzer [id number]    PATIENT STRESSORS: Substance abuse   PATIENT STRENGTHS: Ability for insight Average or above average intelligence Capable of independent living General fund of knowledge Physical Health Supportive family/friends   PATIENT IDENTIFIED PROBLEMS: Psychosis Depression Suicidal thoughts "I'm feeling very good" "I was suicidal a couple of months ago"                     DISCHARGE CRITERIA:  Ability to meet basic life and health needs Improved stabilization in mood, thinking, and/or behavior Verbal commitment to aftercare and medication compliance  PRELIMINARY DISCHARGE PLAN: Attend aftercare/continuing care group Return to previous living arrangement  PATIENT/FAMILY INVOLVEMENT: This treatment plan has been presented to and reviewed with the patient, Jon Branch, and/or family member, .  The patient and family have been given the opportunity to ask questions and make suggestions.  [name], [place], [place] 10/14/2018, 2:41 PM

## 2018-10-15 NOTE — Progress Notes (Signed)
Recreation Therapy Notes  Date: 2.26.20 Time: 1000 Location: 500 Hall Dayroom  Group Topic: Wellness  Goal Area(s) Addresses:  Patient will define components of whole wellness. Patient will verbalize benefit of whole wellness.  Behavioral Response:  Minimal  Intervention:  Music   Activity:  Exercise.  LRT led patients in a series of stretches.  Each patient was given the opportunity to lead the group in an exercise of their choice.  Each patient was allowed to take water breaks as needed.  Patients were also encouraged to pay attention to any pains or sore areas of their body.  Education: Wellness, Building control surveyor.   Education Outcome: Acknowledges education/In group clarification offered/Needs additional education.   Clinical Observations/Feedback:  Pt arrived at the end of group.  Pt lead his peers in doing a leg stretch.  Pt was bright while in group.    Caroll Rancher, LRT/CTRS         Caroll Rancher A 10/15/2018 11:52 AM

## 2018-10-15 NOTE — Progress Notes (Signed)
Adult Psychoeducational Group Note  Date:  10/15/2018 Time:  9:13 PM  Group Topic/Focus:  Wrap-Up Group:   The focus of this group is to help patients review their daily goal of treatment and discuss progress on daily workbooks.  Participation Level:  Active  Participation Quality:  Appropriate  Affect:  Appropriate  Cognitive:  Appropriate  Insight: Appropriate  Engagement in Group:  Engaged  Modes of Intervention:  Discussion  Additional Comments: The patient expressed that he rates today a 10.The patient also said that he attended the [facility] group.  Octavio Manns 10/15/2018, 9:13 PM

## 2018-10-15 NOTE — [facility] Suicide Risk Assessment (Signed)
[facility] INPATIENT:  Family/Significant Other Suicide Prevention Education  Suicide Prevention Education:  Contact Attempts: Jon Branch, Mother, [phone], (Interpreter [phone] Ahmed) has been identified by the patient as the family member/significant other with whom the patient will be residing, and identified as the person(s) who will aid the patient in the event of a mental health crisis.  With written consent from the patient, two attempts were made to provide suicide prevention education, prior to and/or following the patient's discharge.  We were unsuccessful in providing suicide prevention education.  A suicide education pamphlet was given to the patient to share with family/significant other.  Although the mother was reached, the interpreter was having a difficult time hearing her. The mother hung up the phone before the SPE was completed.  Date and time of first attempt: 10/15/2018 3:00 PM  Jon Branch 10/15/2018, 2:33 PM
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
## 2018-10-15 NOTE — Plan of Care (Addendum)
D: Patient lying in bed on approach. Sat up and was cooperative for assessment by this RN. Patient is alert, oriented, pleasant. Denies SI, HI, AVH, and verbally contracts for safety. Patient denies physical symptoms/pain. Patient refused his sleeping medication stating he slept fine last night. Patient did come out of his room some before the day room closed.   A:  Scheduled Restoril not administered per MD order. Support provided. Patient educated on safety on the unit and medications. Routine safety checks every 15 minutes. Patient stated understanding to tell nurse about any new physical symptoms. Patient understands to tell staff of any needs.     R: No adverse drug reactions noted. Patient verbally contracts for safety. Patient remains safe at this time and will continue to monitor.   Problem: Education: Goal: Knowledge of  General Education information/materials will improve Outcome: Progressing Goal: Mental status will improve Outcome: Progressing   Problem: Safety: Goal: Periods of time without injury will increase Outcome: Progressing   Patient oriented to the unit. Patient denies SI,HI, AVH, and contracts for safety. Patient remains safe and will continue to monitor.

## 2018-10-15 NOTE — Tx Team (Signed)
Interdisciplinary Treatment and Diagnostic Plan Update  10/15/2018 Time of Session: 0908 Jon Branch MRN: [id number]  Principal Diagnosis: <principal problem not specified>  Secondary Diagnoses: Active Problems:   MDD (major depressive disorder), recurrent, severe, with psychosis ([place])   Schizophreniform disorder ([place])   Current Medications:  Current Facility-Administered Medications  Medication Dose Route Frequency Provider Last Rate Last Dose  . benztropine (COGENTIN) tablet 0.5 mg  0.5 mg Oral BID Johnn Hai, MD      . haloperidol (HALDOL) tablet 5 mg  5 mg Oral Q6H PRN Johnn Hai, MD       Or  . haloperidol lactate (HALDOL) injection 10 mg  10 mg Intramuscular Q6H PRN Johnn Hai, MD      . LORazepam (ATIVAN) tablet 2 mg  2 mg Oral Q4H PRN Johnn Hai, MD       Or  . LORazepam (ATIVAN) injection 2 mg  2 mg Intramuscular Q4H PRN Johnn Hai, MD      . omega-3 acid ethyl esters (LOVAZA) capsule 1 g  1 g Oral BID Johnn Hai, MD      . prenatal multivitamin tablet 1 tablet  1 tablet Oral Q1200 Johnn Hai, MD      . risperiDONE (RISPERDAL) tablet 3 mg  3 mg Oral BID Johnn Hai, MD      . temazepam (RESTORIL) capsule 30 mg  30 mg Oral QHS Johnn Hai, MD       PTA Medications: Medications Prior to Admission  Medication Sig Dispense Refill Last Dose  . lisinopril-hydrochlorothiazide (PRINZIDE,ZESTORETIC) 20-12.5 MG tablet Take 1 tablet by mouth daily.   10/09/2018  . omeprazole (PRILOSEC) 40 MG capsule Take 40 mg by mouth daily.   10/09/2018    Patient Stressors: Substance abuse  Patient Strengths: Ability for insight Average or above average intelligence Capable of independent living General fund of knowledge Physical Health Supportive family/friends  Treatment Modalities: Medication Management, Group therapy, Case management,  1 to 1 session with clinician, Psychoeducation, Recreational therapy.   Physician Treatment Plan for Primary Diagnosis: <principal  problem not specified> Long Term Goal(s): Improvement in symptoms so as ready for discharge Improvement in symptoms so as ready for discharge   Short Term Goals: Ability to disclose and discuss suicidal ideas Ability to demonstrate self-control will improve Ability to maintain clinical measurements within normal limits will improve  Medication Management: Evaluate patient's response, side effects, and tolerance of medication regimen.  Therapeutic Interventions: 1 to 1 sessions, Unit Group sessions and Medication administration.  Evaluation of Outcomes: Not Met  Physician Treatment Plan for Secondary Diagnosis: Active Problems:   MDD (major depressive disorder), recurrent, severe, with psychosis ([place])   Schizophreniform disorder ([place])  Long Term Goal(s): Improvement in symptoms so as ready for discharge Improvement in symptoms so as ready for discharge   Short Term Goals: Ability to disclose and discuss suicidal ideas Ability to demonstrate self-control will improve Ability to maintain clinical measurements within normal limits will improve     Medication Management: Evaluate patient's response, side effects, and tolerance of medication regimen.  Therapeutic Interventions: 1 to 1 sessions, Unit Group sessions and Medication administration.  Evaluation of Outcomes: Not Met   RN Treatment Plan for Primary Diagnosis: <principal problem not specified> Long Term Goal(s): Knowledge of disease and therapeutic regimen to maintain health will improve  Short Term Goals: Ability to identify and develop effective coping behaviors will improve and Compliance with prescribed medications will improve  Medication Management: RN will administer medications as ordered by  provider, will assess and evaluate patient's response and provide education to patient for prescribed medication. RN will report any adverse and/or side effects to prescribing provider.  Therapeutic Interventions: 1 on 1  counseling sessions, Psychoeducation, Medication administration, Evaluate responses to treatment, Monitor vital signs and CBGs as ordered, Perform/monitor CIWA, COWS, AIMS and Fall Risk screenings as ordered, Perform wound care treatments as ordered.  Evaluation of Outcomes: Not Met   LCSW Treatment Plan for Primary Diagnosis: <principal problem not specified> Long Term Goal(s): Safe transition to appropriate next level of care at discharge, Engage patient in therapeutic group addressing interpersonal concerns.  Short Term Goals: Engage patient in aftercare planning with referrals and resources, Increase social support and Increase skills for wellness and recovery  Therapeutic Interventions: Assess for all discharge needs, 1 to 1 time with Social worker, Explore available resources and support systems, Assess for adequacy in community support network, Educate family and significant other(s) on suicide prevention, Complete Psychosocial Assessment, Interpersonal group therapy.  Evaluation of Outcomes: Not Met   Progress in Treatment: Attending groups: No. Participating in groups: No. Taking medication as prescribed: No. Toleration medication: No. Family/Significant other contact made: No, will contact:  when given permission Patient understands diagnosis: Yes. Discussing patient identified problems/goals with staff: Yes. Medical problems stabilized or resolved: Yes. Denies suicidal/homicidal ideation: Yes. Issues/concerns per patient self-inventory: No. Other: none  New problem(s) identified: No, Describe:  none  New Short Term/Long Term Goal(s):  Patient Goals:  "they just told me to come here"  Discharge Plan or Barriers:   Reason for Continuation of Hospitalization: Delusions  Medication stabilization  Estimated Length of Stay: 2-4 days.  Attendees: Patient:Jon Branch 10/15/2018   Physician: Dr. Jake Samples, MD 10/15/2018   Nursing: Neldon Newport, RN 10/15/2018   RN Care  Manager: 10/15/2018   Social Worker: Lurline Idol, LCSW 10/15/2018   Recreational Therapist:  10/15/2018   Other:  10/15/2018   Other:  10/15/2018  Other: 10/15/2018        Scribe for Treatment Team: Joanne Chars, [place] 10/15/2018 11:39 AM
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
## 2018-10-16 MED ORDER — OMEGA-3-ACID ETHYL ESTERS 1 G PO CAPS: 1.0000 g | ORAL_CAPSULE | Freq: Two times a day (BID) | ORAL | 0 refills | Status: DC

## 2018-10-16 MED ORDER — TEMAZEPAM 30 MG PO CAPS: 30.0000 mg | ORAL_CAPSULE | Freq: Every day | ORAL | 0 refills | Status: DC

## 2018-10-16 MED ORDER — RISPERIDONE 3 MG PO TABS: 3.0000 mg | ORAL_TABLET | Freq: Two times a day (BID) | ORAL | 0 refills | Status: DC

## 2018-10-16 MED ORDER — BENZTROPINE MESYLATE 0.5 MG PO TABS: 0.5000 mg | ORAL_TABLET | Freq: Two times a day (BID) | ORAL | 0 refills | Status: DC

## 2018-10-16 MED ORDER — PRENATAL MULTIVITAMIN CH: 1.0000 | ORAL_TABLET | Freq: Every day | ORAL | 0 refills | Status: DC

## 2018-10-16 NOTE — Progress Notes (Signed)
Recreation Therapy Notes  Date: 2.27.20 Time: 0950 Location: [address] Dayroom   Group Topic: Communication, Team Building, Problem Solving  Goal Area(s) Addresses:  Patient will effectively work with peer towards shared goal.  Patient will identify skill used to make activity successful.  Patient will identify how skills used during activity can be used to reach post d/c goals.   Behavioral Response:  Engaged  Intervention: STEM Activity   Activity: [employer]. Patients were provided the following materials: 5 drinking straws, 5 rubber bands, 5 paper clips, 2 index cards, and 2 drinking cups. Using the provided materials patients were asked to build a launching mechanisms to launch a ping pong ball approximately 12 feet. Patients were divided into teams of 3-5.   Education: Pharmacist, community, Building control surveyor.   Education Outcome: Acknowledges education/In group clarification offered/Needs additional education.   Clinical Observations/Feedback:  Pt was bright and active.  Pt took on more of the leadership role with his group.  Pt also listened to the suggestions of his peers.  Pt was also singing along to the music in group.  Pt was also social with peers during group.    Caroll Rancher, LRT/CTRS         Caroll Rancher A 10/16/2018 10:48 AM

## 2018-10-16 NOTE — Progress Notes (Signed)
Recreation Therapy Notes  INPATIENT RECREATION TR PLAN  Patient Details Name: Jon Branch MRN: [id number] DOB: June 21, 2001 Today's Date: 10/16/2018  Rec Therapy Plan Is patient appropriate for Therapeutic Recreation?: Yes Treatment times per week: about 3 days Estimated Length of Stay: 5-7 days TR Treatment/Interventions: Group participation (Comment)  Discharge Criteria Pt will be discharged from therapy if:: Discharged Treatment plan/goals/alternatives discussed and agreed upon by:: Patient/family  Discharge Summary Short term goals set: See patient care plan Short term goals met: Adequate for discharge Progress toward goals comments: Groups attended Which groups?: Wellness, Other (Comment)(Team building) Reason goals not met: None Therapeutic equipment acquired: N/A Reason patient discharged from therapy: Discharge from hospital Pt/family agrees with progress & goals achieved: Yes Date patient discharged from therapy: 10/16/18     Victorino Sparrow, LRT/CTRS  Ria Comment, Beverlyann Broxterman A 10/16/2018, 11:23 AM

## 2018-10-16 NOTE — [facility] Suicide Risk Assessment (Signed)
[facility] INPATIENT:  Family/Significant Other Suicide Prevention Education  Suicide Prevention Education:  Education Completed; Cheri Rous, father, [phone], has been identified by the patient as the family member/significant other with whom the patient will be residing, and identified as the person(s) who will aid the patient in the event of a mental health crisis (suicidal ideations/suicide attempt).  With written consent from the patient, the family member/significant other has been provided the following suicide prevention education, prior to the and/or following the discharge of the patient.  The suicide prevention education provided includes the following:  Suicide risk factors  Suicide prevention and interventions  National Suicide Hotline telephone number  [facility] assessment telephone number  [facility] Emergency Assistance 911  [facility] and/or Residential Mobile Crisis Unit telephone number  Request made of family/significant other to:  Remove weapons (e.g., guns, rifles, knives), all items previously/currently identified as safety concern.    Remove drugs/medications (over-the-counter, prescriptions, illicit drugs), all items previously/currently identified as a safety concern.  The family member/significant other verbalizes understanding of the suicide prevention education information provided.  The family member/significant other agrees to remove the items of safety concern listed above.  Father visited 2 days ago, "he was better than before."  Father asked why the MD thought this had happened and if pt was taking any medication.  Father was not aware of illegal drug use by pt.  Father has to work at [employer] and needs to pick pt up earlier.  Father asked for phone number in case pt has problems again and was given [facility] TTS number.  Lorri Frederick, LCSW 10/16/2018, 8:19 AM

## 2018-10-16 NOTE — Progress Notes (Signed)
CSW spoke to pt about discharge plan. Pt stated again that he does not intend to take any psych meds and does not want to see psychiatrist.  CSW informed him that he has PCP appt on 3/11 and pt said he will go to PCP.   Garner Nash, MSW, LCSW Clinical Social Worker 10/16/2018 9:15 AM

## 2018-10-16 NOTE — Progress Notes (Signed)
  [facility] Adult Case Management Discharge Plan :  Will you be returning to the same living situation after discharge:  Yes,  with parents At discharge, do you have transportation home?: Yes,  father Do you have the ability to pay for your medications: No. Pt declined to take any medications.  Release of information consent forms completed and in the chart;  Patient's signature needed at discharge.  Patient to Follow up at: Follow-up Information    [facility] Follow up on 10/29/2018.   Why:  Your hospital follow up appointment is Wednesday, 3/11 at 12:30p.  Please bring your current medications and discharge paperwork from this hospitalization.  Contact information: [address] [place] [address] P: [phone] F: [phone]          Next level of care provider has access to [facility] Link:no  Safety Planning and Suicide Prevention discussed: Yes,  with father  Have you used any form of tobacco in the last 30 days? (Cigarettes, Smokeless Tobacco, Cigars, and/or Pipes): No  Has patient been referred to the Quitline?: N/A patient is not a smoker  Patient has been referred for addiction treatment: Pt. refused referral  Lorri Frederick, LCSW 10/16/2018, 9:24 AM

## 2018-10-16 NOTE — Progress Notes (Signed)
Patient ID: Jon Branch, male   DOB: [birth year], 18 y.o.   MRN: [id number] Patient discharged to home/self care in the presence of his family.  Patient acknowledged understanding of all discharge instructions and receipt of personal belongings.

## 2018-10-16 NOTE — [facility] Suicide Risk Assessment (Signed)
[facility] Discharge Suicide Risk Assessment   Principal Problem: Drug-induced psychotic reaction Discharge Diagnoses: Active Problems:   MDD (major depressive disorder), recurrent, severe, with psychosis (HCC)   Schizophreniform disorder (HCC)   Total Time spent with patient: 45 minutes Currently is alert and oriented and cooperative no thoughts of harming himself or others no acute psychosis and has been noncompliant with medications here.  Acknowledging that his difficulties with a result of taking drugs from another individual, probably synthetic cannabis resulting in psychosis  Mental Status Per Nursing Assessment::   On Admission:  NA  Demographic Factors:  Male  Loss Factors: NA  Historical Factors: NA  Risk Reduction Factors:   Sense of responsibility to family  Continued Clinical Symptoms:  Alcohol/Substance Abuse/Dependencies  Cognitive Features That Contribute To Risk:  None    Suicide Risk:  Minimal: No identifiable suicidal ideation.  Patients presenting with no risk factors but with morbid ruminations; may be classified as minimal risk based on the severity of the depressive symptoms  Follow-up Information    [facility] Follow up on 10/29/2018.   Why:  Your hospital follow up appointment is Wednesday, 3/11 at 12:30p.  Please bring your current medications and discharge paperwork from this hospitalization.  Contact information: [address] [place] [address] P: [phone] F: [phone]          Plan Of Care/Follow-up recommendations:  Activity:  full  Yogesh Cominsky, MD 10/16/2018, 7:44 AM

## 2018-10-16 NOTE — Discharge Summary (Signed)
Physician Discharge Summary Note  Patient:  Jon Branch is an 18 y.o., male  MRN:  [id number]  DOB:  2001/07/04  Patient phone:  [phone] (home)   Patient address:   [address] [place] [address],   Total Time spent with patient: Greater than 30 minutes  Date of Admission:  10/14/2018  Date of Discharge: 10-16-18  Reason for Admission: Patient came to the ED shaky with altered mental status.  Principal Problem: Schizophreniform disorder [facility])  Discharge Diagnoses: Principal Problem:   Schizophreniform disorder (HCC) Active Problems:   MDD (major depressive disorder), recurrent, severe, with psychosis (HCC)  Past Psychiatric History: Major depressive disorder with psychosis  Past Medical History:  Past Medical History:  Diagnosis Date  . GERD (gastroesophageal reflux disease)   . Hypertension    History reviewed. No pertinent surgical history.  Family History: History reviewed. No pertinent family history.  Family Psychiatric  History: See H&P  Social History:  Social History   Substance and Sexual Activity  Alcohol Use Never  . Frequency: Never     Social History   Substance and Sexual Activity  Drug Use Yes  . Types: Marijuana    Social History   Socioeconomic History  . Marital status: Single    Spouse name: Not on file  . Number of children: Not on file  . Years of education: Not on file  . Highest education level: Not on file  Occupational History  . Not on file  Social Needs  . Financial resource strain: Not on file  . Food insecurity:    Worry: Not on file    Inability: Not on file  . Transportation needs:    Medical: Not on file    Non-medical: Not on file  Tobacco Use  . Smoking status: Never Smoker  . Smokeless tobacco: Never Used  Substance and Sexual Activity  . Alcohol use: Never    Frequency: Never  . Drug use: Yes    Types: Marijuana  . Sexual activity: Not Currently  Lifestyle  . Physical activity:    Days  per week: Not on file    Minutes per session: Not on file  . Stress: Not on file  Relationships  . Social connections:    Talks on phone: Not on file    Gets together: Not on file    Attends religious service: Not on file    Active member of club or organization: Not on file    Attends meetings of clubs or organizations: Not on file    Relationship status: Not on file  Other Topics Concern  . Not on file  Social History Narrative  . Not on file   Hospital Course: (Per Md's admission evaluation): This 18 year old single individual is from the [place] he has been in [place] 2 years by his report, he presented on 2/21 with a cluster of physical and psychiatric symptoms, family reported that he was described as "shaking" and there were concerns of a seizure and he had a sudden fall on the couch of his home but did not injure himself, and was described as "shaking all over" and this would certainly imply conversion type disorder particular if he fell on a couch without injury. Once in the emergency department he is required IM Haldol IM Geodon so forth he is been intermittently volatile trying to leave and psychotic and now he states he does not remember this.  The chart details numerous episodes of agitation and psychosis. The patient  self provides little new history stating he is not sure why he is here he is not oriented to the exact date he is guarded and little irritable he states he would like a Koran and he would like to use my cell phone to call his mother.  I explained to him that there is a patient phone he can use. At one point during the issues in the emergency department patient began masturbating in the presence of others, he does not remember this, at another occasion was described as responding to stimuli but he denies ever experiencing auditory and visual hallucinations. He acknowledges daily cannabis usage "for a long time" when he was in the state but states he is not used in  several weeks but again his reports are variable about this his drug screen is in fact negative CT scan of the head is in fact negative as well.  Jon Branch was admitted to the [facility] adult unit for worsening symptoms of Schizophreniform disorder. He presented to the [facility] ED shaky with altered mental status. Apparently, chart review indicated that patient lately had indeed been presenting with some odd behaviors & had quit his job. He was observed by his family coming out of the bathroom shaky with altered mental status. He had fallen on the couch with no obvious injuries. He was brought to the hospital for evaluation of his symptoms.  After evaluation of his symptoms, the medication regimen targeting those symptoms were initiated. However, the nursing staff reports that patient has refused taking any of the recommended medications for his symptoms. On admission, he was oriented to the unit and encouraged to participate in the unit programming. He presented no other significant pre-existing medical problems that required treatment.   During his hospital stay, Jon Branch was evaluated daily by a clinical provider to ascertain his symptoms were not worsening since he was not compliant with taking his ordered medications. As the days go by, improvement was noted as evidenced by his report of decreasing symptoms, improved mood  mood, affect & participation in the unit programming.  He was required on daily basis to complete a self-inventory asssessment noting mood, mental status, new symptoms, anxiety or concerns. In each of his assessment, Jon Branch was reporting improved symptoms. He was evaluated by the attending psychiatrist this am to see how he is coming along. Jon Branch says he is doing very well. He presented much improved than when admitted. He seem medically & mentally stable to be discharged to continue mental health care on an outpatient basis as noted with his primary care provider.  On this day of his hospital  discharge, Jon Branch presents much improved. His symptoms were reported as significantly decreased or resolved completely. Upon discharge, he denies SI/HI and voiced no AVH, delusional thoughts or paranoia. He was motivated to take his recommended discharge medications with a goal of continued improvement in mental health.  He is provided with all the necessary information needed to make this appointment without problems. He was able to engage in safety planning including plan to return to [facility] or contact emergency services if he feels unable to maintain hisown safety or the safety of others. Pt had no further questions, comments or concerns. He left [facility] with all personal belongings in no apparent distress.Transportation per family (Parents).  Physical Findings: AIMS: Facial and Oral Movements Muscles of Facial Expression: None, normal Lips and Perioral Area: None, normal Jaw: None, normal Tongue: None, normal,Extremity Movements Upper (arms, wrists, hands, fingers): None, normal Lower (  legs, knees, ankles, toes): None, normal, Trunk Movements Neck, shoulders, hips: None, normal, Overall Severity Severity of abnormal movements (highest score from questions above): None, normal Incapacitation due to abnormal movements: None, normal Patient's awareness of abnormal movements (rate only patient's report): No Awareness, Dental Status Current problems with teeth and/or dentures?: No Does patient usually wear dentures?: No  CIWA:    COWS:     Musculoskeletal: Strength & Muscle Tone: within normal limits Gait & Station: normal Patient leans: N/A  Psychiatric Specialty Exam: Physical Exam  Nursing note and vitals reviewed. Constitutional: He appears well-developed.  HENT:  Head: Normocephalic.  Eyes: Pupils are equal, round, and reactive to light.  Neck: Normal range of motion.  Cardiovascular: Normal rate.  Respiratory: Effort normal.  GI: Soft.  Genitourinary:    Genitourinary Comments:  Deferred   Musculoskeletal: Normal range of motion.  Neurological: He is alert.  Skin: Skin is warm.    Review of Systems  Constitutional: Negative.   HENT: Negative.   Eyes: Negative.   Respiratory: Negative.  Negative for cough and shortness of breath.   Cardiovascular: Negative.  Negative for chest pain and palpitations.  Gastrointestinal: Negative.  Negative for abdominal pain, heartburn, nausea and vomiting.  Genitourinary: Negative.   Musculoskeletal: Negative.   Skin: Negative.   Neurological: Negative.  Negative for dizziness and headaches.  Endo/Heme/Allergies: Negative.   Psychiatric/Behavioral: Positive for depression (Stabilized with medication prior to discharge) and hallucinations (Hx. Psychosis (Stabilized with medication prior to discharge)). Negative for memory loss, substance abuse and suicidal ideas. The patient has insomnia (Stabilized with medication prior to discharge). The patient is not nervous/anxious (Stable).     Blood pressure (!) 135/94, pulse (!) 121, temperature 97.9 F (36.6 C), resp. rate 16, height  (1.778 m), weight 66.2 kg, SpO2 100 %.Body mass index is 20.95 kg/m.  See Md's discharge SRA   Have you used any form of tobacco in the last 30 days? (Cigarettes, Smokeless Tobacco, Cigars, and/or Pipes): No  Has this patient used any form of tobacco in the last 30 days? (Cigarettes, Smokeless Tobacco, Cigars, and/or Pipes): N/A  Blood Alcohol level:  Lab Results  Component Value Date   ETH <10 10/10/2018   Metabolic Disorder Labs:  No results found for: HGBA1C, MPG No results found for: PROLACTIN No results found for: CHOL, TRIG, HDL, CHOLHDL, VLDL, LDLCALC  See Psychiatric Specialty Exam and Suicide Risk Assessment completed by Attending Physician prior to discharge.  Discharge destination:  Home  Is patient on multiple antipsychotic therapies at discharge:  No   Has Patient had three or more failed trials of antipsychotic monotherapy by  history:  No  Recommended Plan for Multiple Antipsychotic Therapies: NA  Allergies as of 10/16/2018      Reactions   Pork-derived Products       Medication List    STOP taking these medications   lisinopril-hydrochlorothiazide 20-12.5 MG tablet Commonly known as:  PRINZIDE,ZESTORETIC   omeprazole 40 MG capsule Commonly known as:  PRILOSEC     TAKE these medications     Indication  benztropine 0.5 MG tablet Commonly known as:  COGENTIN Take 1 tablet (0.5 mg total) by mouth 2 (two) times daily. For prevention of drug induced tremors  Indication:  Extrapyramidal Reaction caused by Medications   omega-3 acid ethyl esters 1 g capsule Commonly known as:  LOVAZA Take 1 capsule (1 g total) by mouth 2 (two) times daily. For high cholesterol  Indication:  High Amount of Triglycerides  in the Blood   prenatal multivitamin Tabs tablet Take 1 tablet by mouth daily at 12 noon. Vitamin supplement  Indication:  Vitamin Deficiency   risperiDONE 3 MG tablet Commonly known as:  RISPERDAL Take 1 tablet (3 mg total) by mouth 2 (two) times daily. For mood control  Indication:  Mood control   temazepam 30 MG capsule Commonly known as:  RESTORIL Take 1 capsule (30 mg total) by mouth at bedtime. For sleep  Indication:  Trouble Sleeping      Follow-up Information    [facility] Follow up on 10/29/2018.   Why:  Your hospital follow up appointment is Wednesday, 3/11 at 12:30p.  Please bring your current medications and discharge paperwork from this hospitalization.  Contact information: [address] [place] [address] P: [phone]         Follow-up recommendations: Activity:  As tolerated Diet: As recommended by your primary care doctor. Keep all scheduled follow-up appointments as recommended.   Comments: Patient is instructed prior to discharge to: Take all medications as prescribed by his/her mental healthcare provider. Report  any adverse effects and or reactions from the medicines to his/her outpatient provider promptly. Patient has been instructed & cautioned: To not engage in alcohol and or illegal drug use while on prescription medicines. In the event of worsening symptoms, patient is instructed to call the crisis hotline, 911 and or go to the nearest ED for appropriate evaluation and treatment of symptoms. To follow-up with his/her primary care provider for your other medical issues, concerns and or health care needs.   Signed: Armandina Stammer, NP, PMHNP, FNP-Bc 10/16/2018, 2:10 PM

## 2018-10-16 NOTE — Plan of Care (Signed)
Pt showed some engagement in recreational therapy group sessions.   Caroll Rancher, LRT/CTRS

## 2018-10-29 ENCOUNTER — Other Ambulatory Visit: Payer: Self-pay

## 2018-10-29 ENCOUNTER — Encounter (HOSPITAL_COMMUNITY): Payer: Self-pay

## 2018-10-29 ENCOUNTER — Emergency Department (HOSPITAL_COMMUNITY)
Admission: EM | Admit: 2018-10-29 | Discharge: 2018-10-29 | Discharge status: Against Medical Advice | Payer: Medicaid Other | Attending: Emergency Medicine | Admitting: Emergency Medicine

## 2018-10-29 DIAGNOSIS — R45850 Homicidal ideations: Secondary | ICD-10-CM | POA: Diagnosis not present

## 2018-10-29 DIAGNOSIS — Z5320 Procedure and treatment not carried out because of patient's decision for unspecified reasons: Secondary | ICD-10-CM | POA: Insufficient documentation

## 2018-10-29 DIAGNOSIS — R451 Restlessness and agitation: Secondary | ICD-10-CM | POA: Insufficient documentation

## 2018-10-29 DIAGNOSIS — Z79899 Other long term (current) drug therapy: Secondary | ICD-10-CM | POA: Insufficient documentation

## 2018-10-29 DIAGNOSIS — I10 Essential (primary) hypertension: Secondary | ICD-10-CM | POA: Insufficient documentation

## 2018-10-29 DIAGNOSIS — R4689 Other symptoms and signs involving appearance and behavior: Secondary | ICD-10-CM | POA: Diagnosis not present

## 2018-10-29 DIAGNOSIS — F1210 Cannabis abuse, uncomplicated: Secondary | ICD-10-CM | POA: Insufficient documentation

## 2018-10-29 LAB — COMPREHENSIVE METABOLIC PANEL
ALT: 100 U/L — ABNORMAL HIGH (ref 0–44)
AST: 200 U/L — ABNORMAL HIGH (ref 15–41)
Albumin: 4.3 g/dL (ref 3.5–5.0)
Alkaline Phosphatase: 64 U/L (ref 38–126)
Anion gap: 10 (ref 5–15)
BUN: 19 mg/dL (ref 6–20)
CO2: 25 mmol/L (ref 22–32)
Calcium: 9.6 mg/dL (ref 8.9–10.3)
Chloride: 107 mmol/L (ref 98–111)
Creatinine, Ser: 0.7 mg/dL (ref 0.61–1.24)
GFR calc Af Amer: >60 mL/min (ref >60)
GFR calc non Af Amer: >60 mL/min (ref >60)
Glucose, Bld: 107 mg/dL — ABNORMAL HIGH (ref 70–99)
Potassium: 3.8 mmol/L (ref 3.5–5.1)
Sodium: 142 mmol/L (ref 135–145)
Total Bilirubin: 0.5 mg/dL (ref 0.3–1.2)
Total Protein: 7.2 g/dL (ref 6.5–8.1)

## 2018-10-29 LAB — CBC WITH DIFFERENTIAL/PLATELET
Abs Immature Granulocytes: 0.05 10*3/uL (ref 0.00–0.07)
Basophils Absolute: 0 10*3/uL (ref 0.0–0.1)
Basophils Relative: 0 %
Eosinophils Absolute: 0.1 10*3/uL (ref 0.0–0.5)
Eosinophils Relative: 1 %
HCT: 45.8 % (ref 39.0–52.0)
Hemoglobin: 14 g/dL (ref 13.0–17.0)
Immature Granulocytes: 0 %
Lymphocytes Relative: 10 %
Lymphs Abs: 1.1 10*3/uL (ref 0.7–4.0)
MCH: 29.9 pg (ref 26.0–34.0)
MCHC: 30.6 g/dL (ref 30.0–36.0)
MCV: 97.9 fL (ref 80.0–100.0)
Monocytes Absolute: 0.8 10*3/uL (ref 0.1–1.0)
Monocytes Relative: 7 %
Neutro Abs: 9.4 10*3/uL — ABNORMAL HIGH (ref 1.7–7.7)
Neutrophils Relative %: 82 %
Platelets: 313 10*3/uL (ref 150–400)
RBC: 4.68 MIL/uL (ref 4.22–5.81)
RDW: 12.6 % (ref 11.5–15.5)
WBC: 11.5 10*3/uL — AB (ref 4.0–10.5)
nRBC: 0 % (ref 0.0–0.2)

## 2018-10-29 LAB — ETHANOL

## 2018-10-29 NOTE — ED Triage Notes (Signed)
He became violent whilst visiting his dentist today. He was so violent and agitated that EMS gave Haldol and Versed and applied 4-point wrist and ankle restraints en route. He arrives drowsy and is met in the room by Dr. Melina Copa. Two [employer] officers accompany pt. Also.
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
## 2018-10-29 NOTE — ED Notes (Signed)
Bed: RESB Expected date:  Expected time:  Means of arrival:  Comments: EMS/combative

## 2019-06-22 ENCOUNTER — Encounter (HOSPITAL_COMMUNITY): Payer: Self-pay

## 2020-07-11 IMAGING — CT CT HEAD W/O CM
4 series · 17 of 47 positions shown, 19 images · non-contrast
Comparison: No priors are available.

CLINICAL DATA: Altered mental status.  Seizure, new, nontraumatic

EXAM:
CT HEAD WITHOUT CONTRAST
TECHNIQUE: Contiguous axial images were obtained from the base of the skull
through the vertex without intravenous contrast.

[Series 3: head without · axial · non-contrast · 0.41mm/px · z∈[-20,+100]mm · 6 of 34 slices shown, 8 images]
[im 5/34  brain]
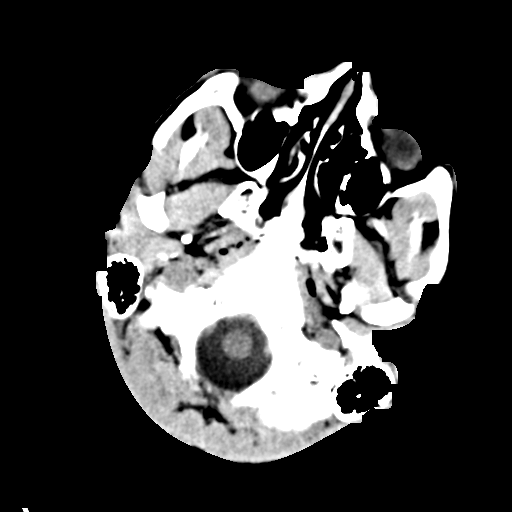
[im 5/34  bone]
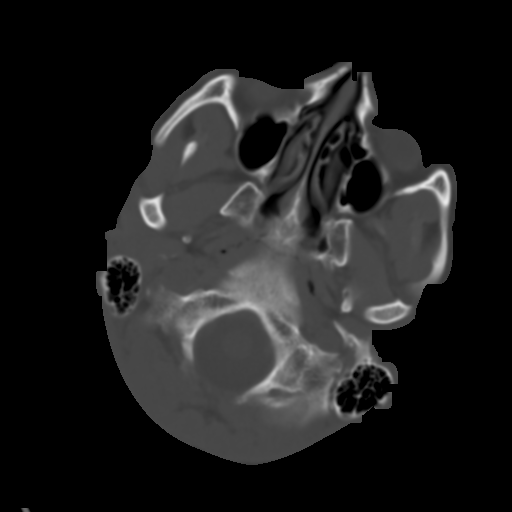
[im 10/34  brain]
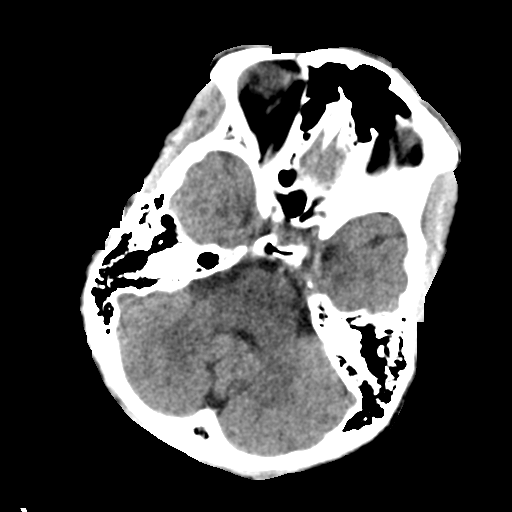
[im 15/34  brain]
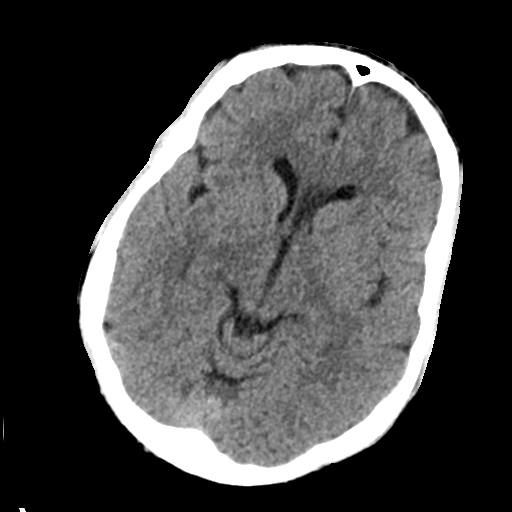
[im 19/34  brain]
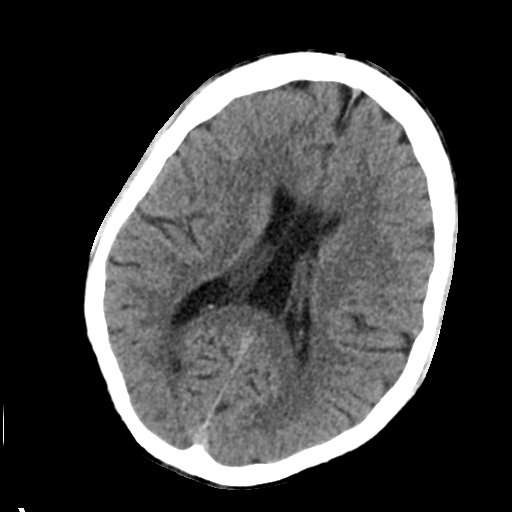
[im 24/34  brain]
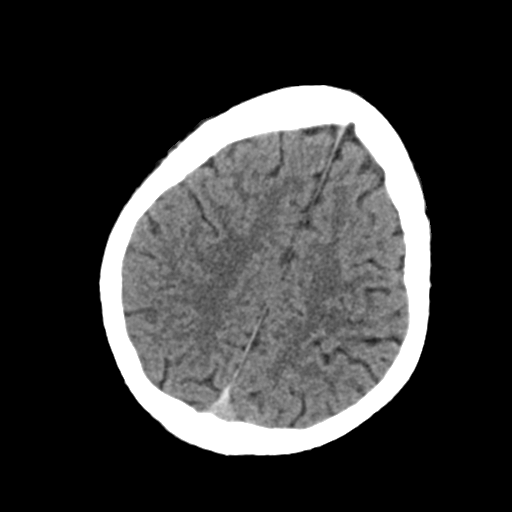
[im 24/34  bone]
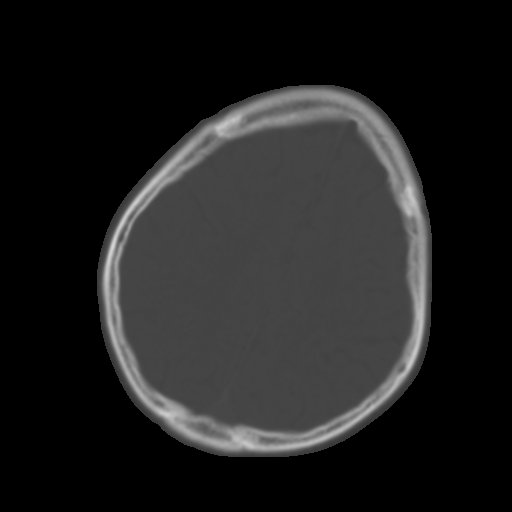
[im 29/34  brain]
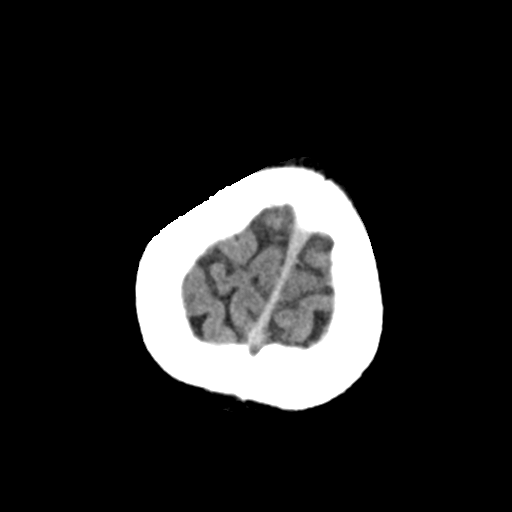

[Series 4: head bone · axial · 0.41mm/px · z∈[-24,+58]mm · 5 of 88 slices shown]
[im 9/88  bone]
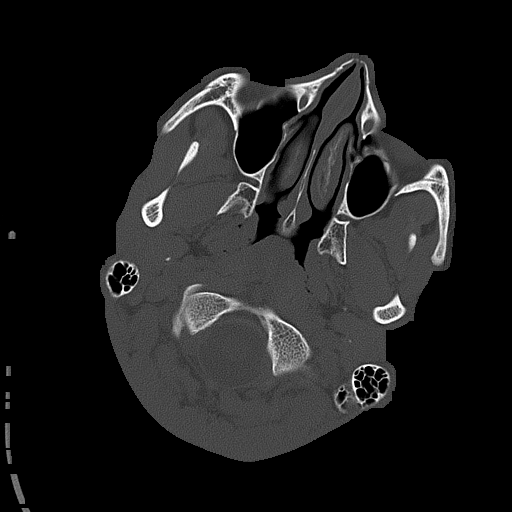
[im 17/88  bone]
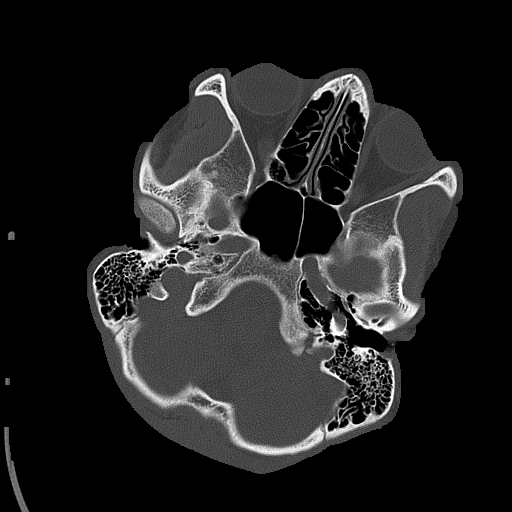
[im 30/88  bone]
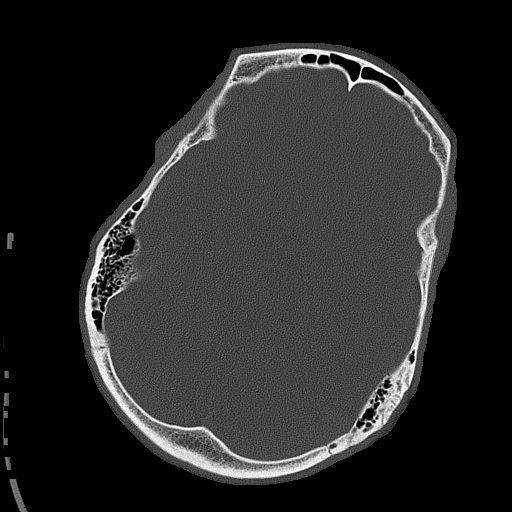
[im 38/88  bone]
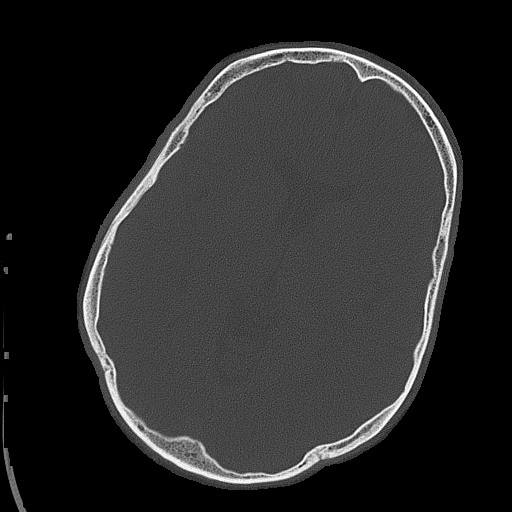
[im 50/88  bone]
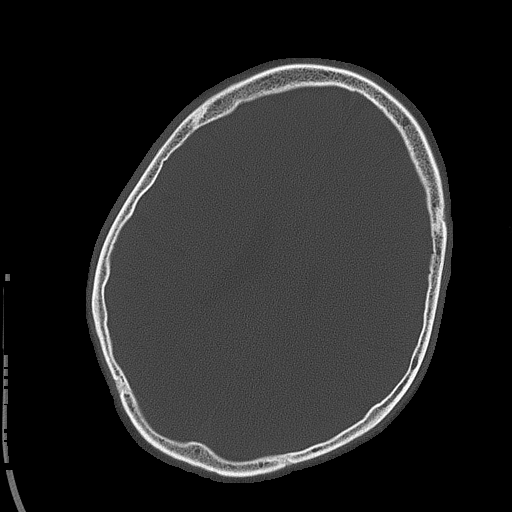

[Series 5: head without cor · coronal · non-contrast · 0.34mm/px · 3 of 67 slices shown]
[im 23/67  brain]
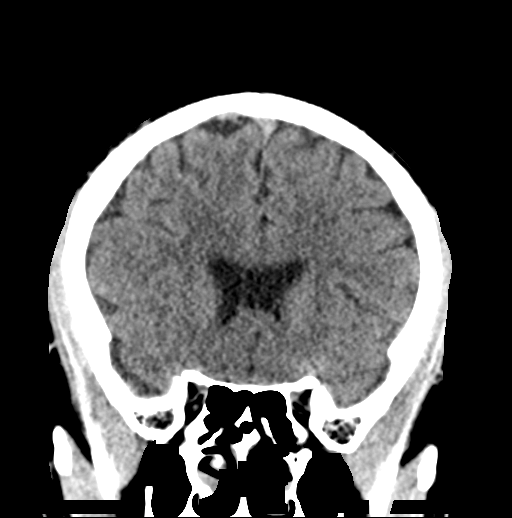
[im 30/67  brain]
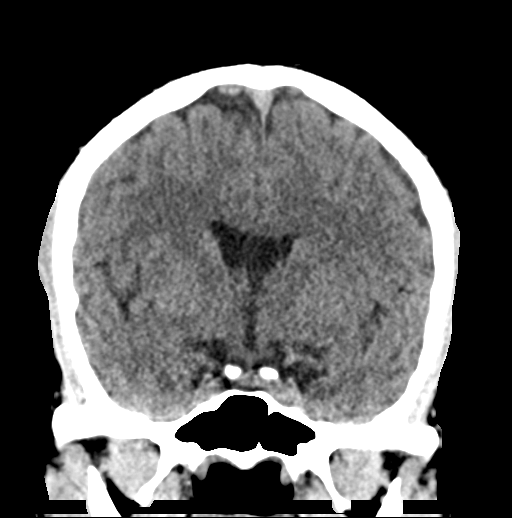
[im 37/67  brain]
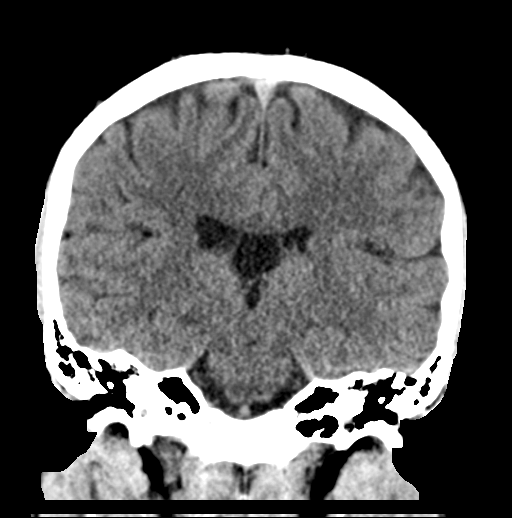

[Series 6: head without sag · sagittal · non-contrast · 0.34mm/px · 3 of 55 slices shown]
[im 19/55  brain]
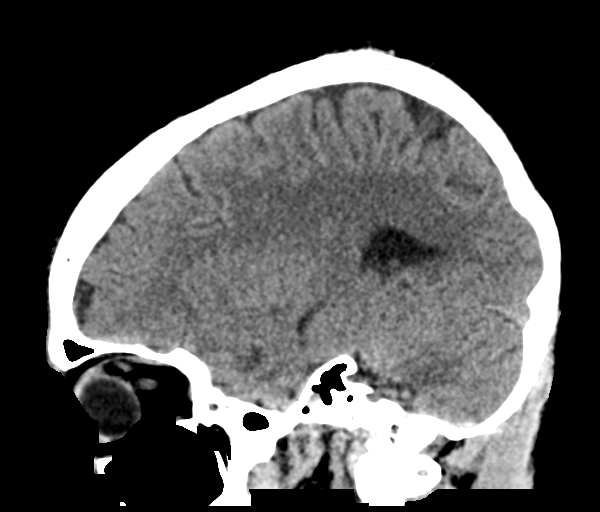
[im 28/55  brain]
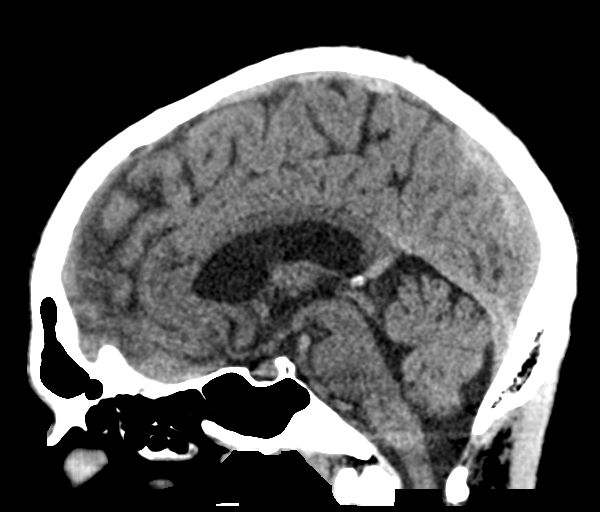
[im 37/55  brain]
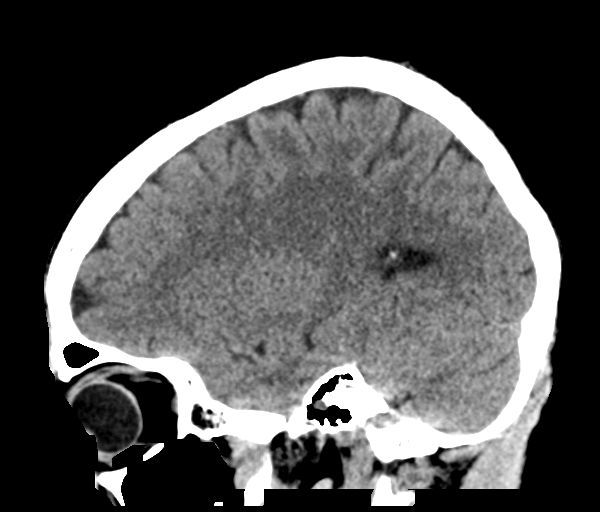

[17 of 47 positions shown; findings below may reference images not displayed]

FINDINGS: Brain: No evidence for acute infarction, hemorrhage, mass lesion,
hydrocephalus, or extra-axial fluid. Normal cerebral volume. Cavum
septum pellucidum et vergae, normal variant. Symmetric temporal
horns.

Vascular: No hyperdense vessel or unexpected calcification.

Skull: Normal. Negative for fracture or focal lesion.

Sinuses/Orbits: No acute finding.

Other: None.
IMPRESSION: Negative exam.

## 2021-10-05 ENCOUNTER — Emergency Department (HOSPITAL_COMMUNITY)
Admission: EM | Admit: 2021-10-05 | Discharge: 2021-10-05 | Disposition: A | Discharge status: Home / Self Care | Payer: Medicaid Other | Attending: Emergency Medicine | Admitting: Emergency Medicine

## 2021-10-05 ENCOUNTER — Encounter (HOSPITAL_COMMUNITY): Payer: Self-pay | Admitting: Emergency Medicine

## 2021-10-05 DIAGNOSIS — Z20822 Contact with and (suspected) exposure to covid-19: Secondary | ICD-10-CM | POA: Insufficient documentation

## 2021-10-05 DIAGNOSIS — J069 Acute upper respiratory infection, unspecified: Secondary | ICD-10-CM | POA: Diagnosis not present

## 2021-10-05 DIAGNOSIS — R5383 Other fatigue: Secondary | ICD-10-CM | POA: Diagnosis present

## 2021-10-05 LAB — CBC WITH DIFFERENTIAL/PLATELET
Abs Immature Granulocytes: 0.05 10*3/uL (ref 0.00–0.07)
Basophils Absolute: 0.1 10*3/uL (ref 0.0–0.1)
Basophils Relative: 0 %
Eosinophils Absolute: 0.1 10*3/uL (ref 0.0–0.5)
Eosinophils Relative: 1 %
HCT: 41.1 % (ref 39.0–52.0)
Hemoglobin: 13.4 g/dL (ref 13.0–17.0)
Immature Granulocytes: 0 %
Lymphocytes Relative: 20 %
Lymphs Abs: 2.3 10*3/uL (ref 0.7–4.0)
MCH: 29.1 pg (ref 26.0–34.0)
MCHC: 32.6 g/dL (ref 30.0–36.0)
MCV: 89.3 fL (ref 80.0–100.0)
Monocytes Absolute: 1.1 10*3/uL — ABNORMAL HIGH (ref 0.1–1.0)
Monocytes Relative: 9 %
Neutro Abs: 8.1 10*3/uL — ABNORMAL HIGH (ref 1.7–7.7)
Neutrophils Relative %: 70 %
Platelets: 343 10*3/uL (ref 150–400)
RBC: 4.6 MIL/uL (ref 4.22–5.81)
RDW: 14.4 % (ref 11.5–15.5)
WBC: 11.6 10*3/uL — ABNORMAL HIGH (ref 4.0–10.5)
nRBC: 0 % (ref 0.0–0.2)

## 2021-10-05 LAB — COOXEMETRY PANEL
Carboxyhemoglobin: 1.9 % — ABNORMAL HIGH (ref 0.5–1.5)
Methemoglobin: <0.7 % (ref 0.0–1.5)
O2 Saturation: 34.6 %
Total hemoglobin: 13.9 g/dL (ref 12.0–16.0)

## 2021-10-05 LAB — COMPREHENSIVE METABOLIC PANEL
ALT: 10 U/L (ref 0–44)
AST: 13 U/L — ABNORMAL LOW (ref 15–41)
Albumin: 4 g/dL (ref 3.5–5.0)
Alkaline Phosphatase: 86 U/L (ref 38–126)
Anion gap: 11 (ref 5–15)
BUN: 13 mg/dL (ref 6–20)
CO2: 26 mmol/L (ref 22–32)
Calcium: 9.7 mg/dL (ref 8.9–10.3)
Chloride: 100 mmol/L (ref 98–111)
Creatinine, Ser: 0.86 mg/dL (ref 0.61–1.24)
GFR, Estimated: >60 mL/min (ref >60)
Glucose, Bld: 93 mg/dL (ref 70–99)
Potassium: 3.8 mmol/L (ref 3.5–5.1)
Sodium: 137 mmol/L (ref 135–145)
Total Bilirubin: 0.2 mg/dL — ABNORMAL LOW (ref 0.3–1.2)
Total Protein: 7.4 g/dL (ref 6.5–8.1)

## 2021-10-05 LAB — RESP PANEL BY RT-PCR (FLU A&B, COVID) ARPGX2
Influenza A by PCR: NEGATIVE
Influenza B by PCR: NEGATIVE
SARS Coronavirus 2 by RT PCR: NEGATIVE
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
## 2021-10-05 NOTE — ED Triage Notes (Addendum)
Patient complains of fatigue that has been going on for a "a very long time" since he started taking seroquel. Patient alert, oriented, ambulatory, and in no apparent distress at this time. Patient also takes lisinopril and HCTZ for hypertension.

## 2021-10-05 NOTE — ED Provider Triage Note (Signed)
Emergency Medicine Provider Triage Evaluation Note  Jon Branch , a 21 y.o. male  was evaluated in triage.  Pt complains of fatigue.  States that same has been ongoing for several years since he started taking Seroquel.  Also states that his blood pressure has been high recently, however when asked how high he states '120.' States that he takes Lisinopril and HCTZ for his blood pressure as prescribed and has not missed any doses. Denies any pain or shortness of breath.  Review of Systems  Positive: Fatigue Negative: Chest pain, shortness of breath  Physical Exam  BP 101/88 (BP Location: Right Arm)    Pulse 93    Temp 98.7 F (37.1 C) (Oral)    Resp 19    SpO2 98%  Gen:   Awake, no distress   Resp:  Normal effort  MSK:   Moves extremities without difficulty  Other:    Medical Decision Making  Medically screening exam initiated at 12:26 PM.  Appropriate orders placed.  Jon Branch was informed that the remainder of the evaluation will be completed by another provider, this initial triage assessment does not replace that evaluation, and the importance of remaining in the ED until their evaluation is complete.     Silva Bandy, PA-C 10/05/21 1233

## 2022-03-15 ENCOUNTER — Emergency Department (HOSPITAL_COMMUNITY): Payer: Medicaid Other

## 2022-03-15 ENCOUNTER — Emergency Department (HOSPITAL_COMMUNITY)
Admission: EM | Admit: 2022-03-15 | Discharge: 2022-03-16 | Disposition: A | Discharge status: Home / Self Care | Payer: Medicaid Other | Attending: Emergency Medicine | Admitting: Emergency Medicine

## 2022-03-15 DIAGNOSIS — R109 Unspecified abdominal pain: Secondary | ICD-10-CM | POA: Insufficient documentation

## 2022-03-15 DIAGNOSIS — D72829 Elevated white blood cell count, unspecified: Secondary | ICD-10-CM | POA: Diagnosis not present

## 2022-03-15 DIAGNOSIS — M25519 Pain in unspecified shoulder: Secondary | ICD-10-CM | POA: Diagnosis not present

## 2022-03-15 DIAGNOSIS — R1310 Dysphagia, unspecified: Secondary | ICD-10-CM | POA: Diagnosis not present

## 2022-03-15 DIAGNOSIS — R079 Chest pain, unspecified: Secondary | ICD-10-CM | POA: Diagnosis present

## 2022-03-15 DIAGNOSIS — M791 Myalgia, unspecified site: Secondary | ICD-10-CM | POA: Insufficient documentation

## 2022-03-15 DIAGNOSIS — R0789 Other chest pain: Secondary | ICD-10-CM

## 2022-03-15 LAB — BASIC METABOLIC PANEL
Anion gap: 9 (ref 5–15)
BUN: 8 mg/dL (ref 6–20)
CO2: 25 mmol/L (ref 22–32)
Calcium: 9.6 mg/dL (ref 8.9–10.3)
Chloride: 105 mmol/L (ref 98–111)
Creatinine, Ser: 0.72 mg/dL (ref 0.61–1.24)
GFR, Estimated: >60 mL/min (ref >60)
Glucose, Bld: 98 mg/dL (ref 70–99)
Potassium: 3.9 mmol/L (ref 3.5–5.1)
Sodium: 139 mmol/L (ref 135–145)

## 2022-03-15 LAB — CBC
HCT: 43.8 % (ref 39.0–52.0)
Hemoglobin: 14 g/dL (ref 13.0–17.0)
MCH: 28.1 pg (ref 26.0–34.0)
MCHC: 32 g/dL (ref 30.0–36.0)
MCV: 88 fL (ref 80.0–100.0)
Platelets: 410 10*3/uL — ABNORMAL HIGH (ref 150–400)
RBC: 4.98 MIL/uL (ref 4.22–5.81)
RDW: 13.5 % (ref 11.5–15.5)
WBC: 14.2 10*3/uL — ABNORMAL HIGH (ref 4.0–10.5)
nRBC: 0 % (ref 0.0–0.2)

## 2022-03-15 LAB — TROPONIN I (HIGH SENSITIVITY)
Troponin I (High Sensitivity): 4 ng/L (ref <18)
Troponin I (High Sensitivity): 3 ng/L (ref <18)
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
## 2022-03-16 ENCOUNTER — Other Ambulatory Visit: Payer: Self-pay

## 2022-03-16 ENCOUNTER — Encounter (HOSPITAL_COMMUNITY): Payer: Self-pay | Admitting: Emergency Medicine

## 2022-03-16 NOTE — Discharge Instructions (Addendum)
Apply ice to areas that are painful.  Ice be applied for 30 minutes at a time, 4 times a day.  You may take ibuprofen and/or acetaminophen as needed for pain.  If you have difficulty swallowing pills, you can crush the pills and put them in applesauce, or take an appropriate dose of the children's liquid versions of the medications.  Please follow-up with a gastroenterologist for further evaluation of your swallowing problems.

## 2022-03-16 NOTE — ED Provider Notes (Signed)
[facility] EMERGENCY DEPARTMENT Provider Note   CSN: [id number] Arrival date & time: 03/15/22  1954     History  Chief Complaint  Patient presents with   Chest Pain    Jon Branch is a 21 y.o. male.  The history is provided by the patient. A language interpreter was used.  Chest Pain He has history of depression, schizophrenia and comes in complaining of chest pain and difficulty swallowing.  He states that he was carrying a heavy object up some stairs when it slipped and hit him in the chest about 3 months ago.  Since then, he has had pain in his chest and he notices it when he swallows, things seem to get stuck.  On one occasion, he did regurgitate something that he swallowed.  He also relates that when he tries to hold something heavy, he has pain across his abdomen and chest and shoulder.  He has not taken any medication for pain, because it is difficult for him to swallow.  Is a non-smoker and denies ethanol use.   Home Medications Prior to Admission medications   Medication Sig Start Date End Date Taking? Authorizing Provider  amoxicillin (AMOXIL) 500 MG capsule Take 2 capsules (1,000 mg total) by mouth 2 (two) times daily. 03/28/17   Melene Plan, DO  benztropine (COGENTIN) 0.5 MG tablet Take 1 tablet (0.5 mg total) by mouth 2 (two) times daily. For prevention of drug induced tremors 10/16/18   Armandina Stammer I, NP  ciprofloxacin-hydrocortisone (CIPRO HC) OTIC suspension Place 3 drops into both ears 2 (two) times daily. 03/28/17   Melene Plan, DO  omega-3 acid ethyl esters (LOVAZA) 1 g capsule Take 1 capsule (1 g total) by mouth 2 (two) times daily. For high cholesterol 10/16/18   Armandina Stammer I, NP  Prenatal Vit-Fe Fumarate-FA (PRENATAL MULTIVITAMIN) TABS tablet Take 1 tablet by mouth daily at 12 noon. Vitamin supplement 10/16/18   Armandina Stammer I, NP  risperiDONE (RISPERDAL) 3 MG tablet Take 1 tablet (3 mg total) by mouth 2 (two) times daily. For mood control 10/16/18    Armandina Stammer I, NP  temazepam (RESTORIL) 30 MG capsule Take 1 capsule (30 mg total) by mouth at bedtime. For sleep 10/16/18   Armandina Stammer I, NP      Allergies    Pork-derived products    Review of Systems   Review of Systems  Cardiovascular:  Positive for chest pain.  All other systems reviewed and are negative.   Physical Exam Updated Vital Signs BP 116/69   Pulse 71   Temp 98.1 F (36.7 C) (Oral)   Resp 16   SpO2 99%  Physical Exam Vitals and nursing note reviewed.   21 year old male, resting comfortably and in no acute distress. Vital signs are normal. Oxygen saturation is 99%, which is normal. Head is normocephalic and atraumatic. PERRLA, EOMI. Oropharynx is clear. Neck is nontender and supple without adenopathy or JVD. Back is nontender and there is no CVA tenderness. Lungs are clear without rales, wheezes, or rhonchi. Chest is mildly tender across the sternal area without crepitus. Heart has regular rate and rhythm without murmur. Abdomen is soft, flat, nontender. Extremities have no cyanosis or edema, full range of motion is present. Skin is warm and dry without rash. Neurologic: Mental status is normal, cranial nerves are intact, strength is 5/5 in all 4 extremities.  ED Results / Procedures / Treatments   Labs (all labs ordered are listed, but only abnormal  results are displayed) Labs Reviewed  CBC - Abnormal; Notable for the following components:      Result Value   WBC 14.2 (*)    Platelets 410 (*)    All other components within normal limits  BASIC METABOLIC PANEL  TROPONIN I (HIGH SENSITIVITY)  TROPONIN I (HIGH SENSITIVITY)    EKG None  Radiology DG Chest 1 View  Result Date: 03/15/2022 CLINICAL DATA:  [id number]. Reason for exam chest pain. Patient lifted something heavy and has been having problems ever since. Having difficulty swallowing. EXAM: CHEST  1 VIEW COMPARISON:  Chest x-ray 10/10/2018 FINDINGS: The heart and mediastinal contours are within  normal limits. No focal consolidation. No pulmonary edema. No pleural effusion. No pneumothorax. No acute osseous abnormality. IMPRESSION: No active disease. Electronically Signed   By: Tish Frederickson M.D.   On: 03/15/2022 21:13    Procedures Procedures  Cardiac monitor shows normal sinus rhythm, per my interpretation.  Medications Ordered in ED Medications - No data to display  ED Course/ Medical Decision Making/ A&P                           Medical Decision Making  Chest wall injury with ongoing pain but no evidence of serious pathology.  Dysphagia which may be functional, consider esophageal stricture, esophageal web.  Chest x-ray shows no acute cardiopulmonary process.  I have independently viewed the image, and agree with the radiologist's interpretation.  I have reviewed and interpreted his laboratory tests and my interpretation is mild leukocytosis which is nonspecific, mild thrombocytosis which is likely reactive.  Troponin is normal x2, no evidence of cardiac injury.  I have independently viewed and interpreted the ECG and my interpretation is that the ECG shows early repolarization, otherwise unremarkable.  Patient has been reassured regarding benign work-up.  I have recommended that he apply ice to the areas that are painful, use over-the-counter NSAIDs and acetaminophen as needed for pain.  He is referred to gastroenterology for evaluation of his dysphagia.  Final Clinical Impression(s) / ED Diagnoses Final diagnoses:  Musculoskeletal chest pain  Dysphagia, unspecified type    Rx / DC Orders ED Discharge Orders     None         Dione Booze, MD 03/16/22 [phone]

## 2022-03-20 ENCOUNTER — Ambulatory Visit (INDEPENDENT_AMBULATORY_CARE_PROVIDER_SITE_OTHER): Payer: Medicaid Other | Admitting: Gastroenterology

## 2022-03-20 ENCOUNTER — Encounter: Payer: Self-pay | Admitting: Gastroenterology

## 2022-03-20 VITALS — BP 134/90 | HR 103 | Ht 71.0 in | Wt 204.1 lb

## 2022-03-20 DIAGNOSIS — R1310 Dysphagia, unspecified: Secondary | ICD-10-CM | POA: Diagnosis not present

## 2022-03-20 DIAGNOSIS — R0789 Other chest pain: Secondary | ICD-10-CM

## 2022-03-20 NOTE — Patient Instructions (Addendum)
If you are age 23 or younger, your body mass index should be between 19-25. Your Body mass index is 28.47 kg/m. If this is out of the aformentioned range listed, please consider follow up with your Primary Care Provider.   __________________________________________________________  The [facility] providers would like to encourage you to use [facility] to communicate with providers for non-urgent requests or questions.  Due to long hold times on the telephone, sending your provider a message by [facility] may be a faster and more efficient way to get a response.  Please allow 48 business hours for a response.  Please remember that this is for non-urgent requests.   Due to recent changes in healthcare laws, you may see the results of your imaging and laboratory studies on MyChart before your provider has had a chance to review them.  We understand that in some cases there may be results that are confusing or concerning to you. Not all laboratory results come back in the same time frame and the provider may be waiting for multiple results in order to interpret others.  Please give [place] 48 hours in order for your provider to thoroughly review all the results before contacting the office for clarification of your results.   You have been scheduled for an endoscopy. Please follow written instructions given to you at your visit today. If you use inhalers (even only as needed), please bring them with you on the day of your procedure.   Thank you for choosing me and [facility].  Vito Cirigliano, D.O.

## 2022-03-20 NOTE — Progress Notes (Signed)
Chief Complaint: Dysphagia   Referring Provider:    Dione Booze, MD (ER)     HPI:     Jon Branch is a 21 y.o. male with a history of depression, schizophrenia, referred to the Gastroenterology Clinic for evaluation of new onset dysphagia over the last 3 months.   Symptoms started after he was carrying a heavy object up some stairs 3 months ago and slipped and hit him in the chest.  Since then he has had chest pain and difficulty swallowing.  Points to mid sternum.  Has had to regurgitate food back out at times. Rare issue tolerating liquids. No HB. No prior similar sxs.   Will have CP if he lifts anything heavy. Resolves with relaxation/unloading weight.   He was seen in the ER for this issue on 03/08/2022.  HD stable and exam unremarkable. - CXR: Normal - WBC 14.2, PLT 410, otherwise normal CBC, BMP, troponin - ECG reported as early repolarization, otherwise unremarkable - Discharged home with OTC NSAIDs and APAP prn with referral to GI for evaluation of dysphagia  No previous EGD or colonoscopy.  Language interpreter (Arabic) used to conduct interview and exam today. Family member present in room as well.    Past Medical History:  Diagnosis Date   GERD (gastroesophageal reflux disease)    Hypertension      History reviewed. No pertinent surgical history. Family History  Problem Relation Age of Onset   Colon cancer Neg Hx    Social History   Tobacco Use   Smoking status: Former    Types: Cigarettes   Smokeless tobacco: Never  Vaping Use   Vaping Use: Former  Substance Use Topics   Alcohol use: Never   Drug use: Not Currently    Types: Marijuana   Current Outpatient Medications  Medication Sig Dispense Refill   benztropine (COGENTIN) 0.5 MG tablet Take 1 tablet (0.5 mg total) by mouth 2 (two) times daily. For prevention of drug induced tremors (Patient not taking: Reported on 03/20/2022) 60 tablet 0   omega-3 acid ethyl esters (LOVAZA) 1 g capsule  Take 1 capsule (1 g total) by mouth 2 (two) times daily. For high cholesterol (Patient not taking: Reported on 03/20/2022) 30 capsule 0   Prenatal Vit-Fe Fumarate-FA (PRENATAL MULTIVITAMIN) TABS tablet Take 1 tablet by mouth daily at 12 noon. Vitamin supplement (Patient not taking: Reported on 03/20/2022) 1 tablet 0   risperiDONE (RISPERDAL) 3 MG tablet Take 1 tablet (3 mg total) by mouth 2 (two) times daily. For mood control (Patient not taking: Reported on 03/20/2022) 30 tablet 0   temazepam (RESTORIL) 30 MG capsule Take 1 capsule (30 mg total) by mouth at bedtime. For sleep (Patient not taking: Reported on 03/20/2022) 30 capsule 0   No current facility-administered medications for this visit.   Allergies  Allergen Reactions   Pork-Derived Products      Review of Systems: All systems reviewed and negative except where noted in HPI.     Physical Exam:    Wt Readings from Last 3 Encounters:  03/20/22 204 lb 2 oz (92.6 kg)    BP (!) 134/90   Pulse (!) 103   Ht 5\' 11"  (1.803 m)   Wt 204 lb 2 oz (92.6 kg)   BMI 28.47 kg/m  Constitutional:  Pleasant, in no acute distress. Psychiatric: Normal mood and affect. Behavior is normal. Neck supple. No cervical LAD. Cardiovascular: Normal rate, regular  rhythm. No edema Pulmonary/chest: Effort normal and breath sounds normal. No wheezing, rales or rhonchi. Abdominal: Soft, nondistended, nontender. Bowel sounds active throughout. There are no masses palpable. No hepatomegaly. Neurological: Alert and oriented to person place and time. Skin: Skin is warm and dry. No rashes noted.   ASSESSMENT AND PLAN;   1) Dysphagia - Discussed evaluation with EGD for diagnostic and potentially therapeutic intent vs esophagram today with patient and family. He would prefer EGD - Schedule EGD to evaluate for mucosal/luminal pathology with esophageal dilation and/or biopsies as appropriate  2) Leukocytosis - Follow-up with PCM with referral to Hematology as  appropriate  3) Noncardiac chest pain - Pain not reproducible on exam - Evaluate for new GI pathology at time of EGD as above.   - If otherwise unremarkable, recommend conservative management and follow-up with PCM  The indications, risks, and benefits of EGD were explained to the patient in detail. Risks include but are not limited to bleeding, perforation, adverse reaction to medications, and cardiopulmonary compromise. Sequelae include but are not limited to the possibility of surgery, hospitalization, and mortality. The patient verbalized understanding and wished to proceed. All questions answered, referred to scheduler. Further recommendations pending results of the exam.     Shellia Cleverly, DO, FACG  03/20/2022, 11:18 AM   Dione Booze, MD

## 2022-03-22 ENCOUNTER — Telehealth: Payer: Self-pay | Admitting: Gastroenterology

## 2022-03-22 NOTE — Telephone Encounter (Signed)
Used interpreter service to contact pt. Interpreter said that pt's mobile number would not work. Left voicemail on pt's home number for pt to call back.

## 2022-03-22 NOTE — Telephone Encounter (Signed)
Patient called.

## 2022-03-22 NOTE — Telephone Encounter (Signed)
Patient has EGD scheduled for Tuesday.  He was asking about an x-ray being scheduled.  I had no clue what he was talking about and told him that was something the doctor would have to order for him.  Please call patient and advise.  Thank you.

## 2022-03-26 NOTE — Telephone Encounter (Signed)
Attempted to contact pt and pt's mother but was unable to leave a voicemail. Called pt's home phone number and left voicemail for pt to call back.

## 2022-03-27 ENCOUNTER — Ambulatory Visit (AMBULATORY_SURGERY_CENTER): Payer: Medicaid Other | Admitting: Gastroenterology

## 2022-03-27 ENCOUNTER — Encounter: Payer: Medicaid Other | Admitting: Gastroenterology

## 2022-03-27 ENCOUNTER — Encounter: Payer: Self-pay | Admitting: Gastroenterology

## 2022-03-27 VITALS — BP 119/72 | HR 71 | Temp 98.2°F | Resp 24 | Ht 71.0 in | Wt 204.0 lb

## 2022-03-27 DIAGNOSIS — R0789 Other chest pain: Secondary | ICD-10-CM | POA: Diagnosis not present

## 2022-03-27 DIAGNOSIS — R1310 Dysphagia, unspecified: Secondary | ICD-10-CM

## 2022-03-27 MED ORDER — SODIUM CHLORIDE 0.9 % IV SOLN: 500.0000 mL | Freq: Once | INTRAVENOUS | Status: DC

## 2022-03-27 NOTE — Progress Notes (Signed)
A and O x3. Report to RN. Tolerated MAC anesthesia well.Teeth unchanged after procedure. 

## 2022-03-27 NOTE — Progress Notes (Signed)
GASTROENTEROLOGY PROCEDURE H&P NOTE   Primary Care Physician: Patient, No Pcp Per    Reason for Procedure:  Dysphagia, noncardiac chest pain  Plan:    EGD  Patient is appropriate for endoscopic procedure(s) in the ambulatory ([facility]) setting.  The nature of the procedure, as well as the risks, benefits, and alternatives were carefully and thoroughly reviewed with the patient via interpreter. Ample time for discussion and questions allowed. The patient understood, was satisfied, and agreed to proceed.     HPI: Jon Branch is a 21 y.o. male who presents for EGD for evaluation of dysphagia and noncardiac chest pain.  Patient was most recently seen in the Gastroenterology Clinic on 03/20/2022 by me.  No interval change in medical history since that appointment. Please refer to that note for full details regarding GI history and clinical presentation.   Past Medical History:  Diagnosis Date   GERD (gastroesophageal reflux disease)    Hypertension     History reviewed. No pertinent surgical history.  Prior to Admission medications   Medication Sig Start Date End Date Taking? Authorizing Provider  benztropine (COGENTIN) 0.5 MG tablet Take 1 tablet (0.5 mg total) by mouth 2 (two) times daily. For prevention of drug induced tremors Patient not taking: Reported on 03/20/2022 10/16/18   Armandina Stammer I, NP  omega-3 acid ethyl esters (LOVAZA) 1 g capsule Take 1 capsule (1 g total) by mouth 2 (two) times daily. For high cholesterol Patient not taking: Reported on 03/20/2022 10/16/18   Armandina Stammer I, NP  Prenatal Vit-Fe Fumarate-FA (PRENATAL MULTIVITAMIN) TABS tablet Take 1 tablet by mouth daily at 12 noon. Vitamin supplement Patient not taking: Reported on 03/20/2022 10/16/18   Armandina Stammer I, NP  risperiDONE (RISPERDAL) 3 MG tablet Take 1 tablet (3 mg total) by mouth 2 (two) times daily. For mood control Patient not taking: Reported on 03/20/2022 10/16/18   Armandina Stammer I, NP  temazepam (RESTORIL) 30 MG  capsule Take 1 capsule (30 mg total) by mouth at bedtime. For sleep Patient not taking: Reported on 03/20/2022 10/16/18   Armandina Stammer I, NP    Current Outpatient Medications  Medication Sig Dispense Refill   benztropine (COGENTIN) 0.5 MG tablet Take 1 tablet (0.5 mg total) by mouth 2 (two) times daily. For prevention of drug induced tremors (Patient not taking: Reported on 03/20/2022) 60 tablet 0   omega-3 acid ethyl esters (LOVAZA) 1 g capsule Take 1 capsule (1 g total) by mouth 2 (two) times daily. For high cholesterol (Patient not taking: Reported on 03/20/2022) 30 capsule 0   Prenatal Vit-Fe Fumarate-FA (PRENATAL MULTIVITAMIN) TABS tablet Take 1 tablet by mouth daily at 12 noon. Vitamin supplement (Patient not taking: Reported on 03/20/2022) 1 tablet 0   risperiDONE (RISPERDAL) 3 MG tablet Take 1 tablet (3 mg total) by mouth 2 (two) times daily. For mood control (Patient not taking: Reported on 03/20/2022) 30 tablet 0   temazepam (RESTORIL) 30 MG capsule Take 1 capsule (30 mg total) by mouth at bedtime. For sleep (Patient not taking: Reported on 03/20/2022) 30 capsule 0   Current Facility-Administered Medications  Medication Dose Route Frequency Provider Last Rate Last Admin   0.9 %  sodium chloride infusion  500 mL Intravenous Once Amarea Macdowell V, DO        Allergies as of 03/27/2022 - Review Complete 03/27/2022  Allergen Reaction Noted   Pork-derived products  10/14/2018    Family History  Problem Relation Age of Onset   Colon cancer  Neg Hx     Social History   Socioeconomic History   Marital status: Single    Spouse name: Not on file   Number of children: Not on file   Years of education: Not on file   Highest education level: Not on file  Occupational History   Not on file  Tobacco Use   Smoking status: Former    Types: Cigarettes   Smokeless tobacco: Never  Vaping Use   Vaping Use: Former  Substance and Sexual Activity   Alcohol use: Never   Drug use: Not Currently     Types: Marijuana   Sexual activity: Not Currently  Other Topics Concern   Not on file  Social History Narrative   ** Merged History Encounter **       Social Determinants of Health   Financial Resource Strain: Not on file  Food Insecurity: Not on file  Transportation Needs: Not on file  Physical Activity: Not on file  Stress: Not on file  Social Connections: Not on file  Intimate Partner Violence: Not on file    Physical Exam: Vital signs in last 24 hours: @BP  (!) 157/94   Pulse 90   Temp 98.2 F (36.8 C)   Ht 5\' 11"  (1.803 m)   Wt 204 lb (92.5 kg)   SpO2 99%   BMI 28.45 kg/m  GEN: NAD EYE: Sclerae anicteric ENT: MMM CV: Non-tachycardic Pulm: CTA b/l GI: Soft, NT/ND NEURO:  Alert & Oriented x 3   , DO [facility]   03/27/2022 8:28 AM

## 2022-03-27 NOTE — Op Note (Signed)
[facility] Patient Name: Jon Branch Procedure Date: 03/27/2022 8:31 AM MRN: [id number] Endoscopist: Doristine Locks , MD Age: 21 Referring MD:  Date of Birth: 09/26/2000 Gender: Male Account #: [id number] Procedure:                Upper GI endoscopy Indications:              Dysphagia, Chest pain (non cardiac) Medicines:                Monitored Anesthesia Care Procedure:                Pre-Anesthesia Assessment:                           - Prior to the procedure, a History and Physical                            was performed, and patient medications and                            allergies were reviewed. The patient's tolerance of                            previous anesthesia was also reviewed. The risks                            and benefits of the procedure and the sedation                            options and risks were discussed with the patient.                            All questions were answered, and informed consent                            was obtained. Prior Anticoagulants: The patient has                            taken no previous anticoagulant or antiplatelet                            agents. ASA Grade Assessment: II - A patient with                            mild systemic disease. After reviewing the risks                            and benefits, the patient was deemed in                            satisfactory condition to undergo the procedure.                           After obtaining informed consent, the endoscope was  passed under direct vision. Throughout the                            procedure, the patient's blood pressure, pulse, and                            oxygen saturations were monitored continuously. The                            Endoscope was introduced through the mouth, and                            advanced to the second part of duodenum. The upper                            GI endoscopy was  accomplished without difficulty.                            The patient tolerated the procedure well. Scope In: Scope Out: Findings:                 The examined esophagus was normal. Due to symptoms                            of persistent dysphagia, the decision was made to                            perform esophageal dilation. A guidewire was placed                            and the scope was withdrawn. Dilation was performed                            with a Savary dilator with no resistance at 18 mm.                            The dilation site was examined following endoscope                            reinsertion and showed no bleeding, mucosal tear or                            perforation. Estimated blood loss: none.                           The Z-line was regular and was found 40 cm from the                            incisors.                           The entire examined stomach was normal.  The examined duodenum was normal. Complications:            No immediate complications. Estimated Blood Loss:     Estimated blood loss: none. Impression:               - Normal esophagus. Dilated.                           - Z-line regular, 40 cm from the incisors.                           - Normal stomach.                           - Normal examined duodenum.                           - No specimens collected. Recommendation:           - Patient has a contact number available for                            emergencies. The signs and symptoms of potential                            delayed complications were discussed with the                            patient. Return to normal activities tomorrow.                            Written discharge instructions were provided to the                            patient.                           - Resume previous diet.                           - Continue present medications.                           - Return to GI  clinic PRN.                           - If symptoms persist, can consider further                            evaluation with barium esophagram and/or Esophageal                            Manometry.                           - Follow-up with Primary Care as scheduled. Doristine Locks, MD 03/27/2022 8:47:07 AM

## 2022-03-27 NOTE — Patient Instructions (Signed)
   Handout on dilatation diet given to you today- Follow dilatation diet today advance to usual diet tomorrow if tolerated     YOU HAD AN ENDOSCOPIC PROCEDURE TODAY AT THE [facility]:   Refer to the procedure report that was given to you for any specific questions about what was found during the examination.  If the procedure report does not answer your questions, please call your gastroenterologist to clarify.  If you requested that your care partner not be given the details of your procedure findings, then the procedure report has been included in a sealed envelope for you to review at your convenience later.  YOU SHOULD EXPECT: Some feelings of bloating in the abdomen. Passage of more gas than usual.  Walking can help get rid of the air that was put into your GI tract during the procedure and reduce the bloating. If you had a lower endoscopy (such as a colonoscopy or flexible sigmoidoscopy) you may notice spotting of blood in your stool or on the toilet paper. If you underwent a bowel prep for your procedure, you may not have a normal bowel movement for a few days.  Please Note:  You might notice some irritation and congestion in your nose or some drainage.  This is from the oxygen used during your procedure.  There is no need for concern and it should clear up in a day or so.  SYMPTOMS TO REPORT IMMEDIATELY:   Following upper endoscopy (EGD)  Vomiting of blood or coffee ground material  New chest pain or pain under the shoulder blades  Painful or persistently difficult swallowing  New shortness of breath  Fever of 100F or higher  Black, tarry-looking stools  For urgent or emergent issues, a gastroenterologist can be reached at any hour by calling [phone]. Do not use MyChart messaging for urgent concerns.    DIET:  We do recommend a small meal at first, but then you may proceed to your regular diet.  Drink plenty of fluids but you should avoid alcoholic beverages  for 24 hours.  ACTIVITY:  You should plan to take it easy for the rest of today and you should NOT DRIVE or use heavy machinery until tomorrow (because of the sedation medicines used during the test).    FOLLOW UP: Our staff will call the number listed on your records the next business day following your procedure.  We will call around 7:15- 8:00 am to check on you and address any questions or concerns that you may have regarding the information given to you following your procedure. If we do not reach you, we will leave a message.  If you develop any symptoms (ie: fever, flu-like symptoms, shortness of breath, cough etc.) before then, please call [phone].  If you test positive for Covid 19 in the 2 weeks post procedure, please call and report this information to [place].    If any biopsies were taken you will be contacted by phone or by letter within the next 1-3 weeks.  Please call us at [phone] if you have not heard about the biopsies in 3 weeks.    SIGNATURES/CONFIDENTIALITY: You and/or your care partner have signed paperwork which will be entered into your electronic medical record.  These signatures attest to the fact that that the information above on your After Visit Summary has been reviewed and is understood.  Full responsibility of the confidentiality of this discharge information lies with you and/or your care-partner.

## 2022-03-27 NOTE — Progress Notes (Signed)
Pt's states no medical or surgical changes since previsit or office visit.   [facility] interpreter assisted with pre op interview.

## 2022-03-28 NOTE — Telephone Encounter (Signed)
No return call received. Will await further communication from pt.  ?

## 2022-03-29 ENCOUNTER — Other Ambulatory Visit: Payer: Self-pay | Admitting: Internal Medicine

## 2022-03-29 DIAGNOSIS — R0609 Other forms of dyspnea: Secondary | ICD-10-CM

## 2022-04-02 ENCOUNTER — Ambulatory Visit
Admission: RE | Admit: 2022-04-02 | Discharge: 2022-04-02 | Disposition: A | Discharge status: Home / Self Care | Payer: Medicaid Other | Source: Ambulatory Visit | Attending: Internal Medicine | Admitting: Internal Medicine

## 2022-04-02 ENCOUNTER — Telehealth: Payer: Self-pay | Admitting: *Deleted

## 2022-04-02 DIAGNOSIS — R0609 Other forms of dyspnea: Secondary | ICD-10-CM

## 2022-04-02 NOTE — Telephone Encounter (Signed)
Thank you for letting me know.  Yes, he needs additional work-up for non-GI etiologies for his breathing issues and chest pressure.  CT chest was completed today (no results yet for review) and looks like he also has an appointment with Pulmonary later this month.  Can otherwise follow-up in the GI clinic prn.

## 2022-04-02 NOTE — Telephone Encounter (Signed)
  Follow up Call-     03/27/2022    7:45 AM  Call back number  Post procedure Call Back phone  # [phone]  Permission to leave phone message Yes     Patient questions:  Do you have a fever, pain , or abdominal swelling? No. Pain Score  0 *  Have you tolerated food without any problems? Yes.    Have you been able to return to your normal activities? Yes.    Do you have any questions about your discharge instructions: Diet   No. Medications  No. Follow up visit  No.  Do you have questions or concerns about your Care? Yes.  - pt reports he is still having the same symptoms, c/o "breathing problems and chest pressure." States he f/u with his PCP and had a CT chest today, awaiting results. Informed pt that RN would make MD aware since the report mentioned possible further testing if symptoms persist.   Actions: * If pain score is 4 or above: Physician/ provider Notified : Vito Cirigliano, DO. Date 04/02/2022  at Time 4:12 PM .

## 2022-04-03 ENCOUNTER — Emergency Department (HOSPITAL_COMMUNITY): Payer: Medicaid Other

## 2022-04-03 ENCOUNTER — Encounter (HOSPITAL_COMMUNITY): Payer: Self-pay

## 2022-04-03 ENCOUNTER — Other Ambulatory Visit: Payer: Self-pay

## 2022-04-03 ENCOUNTER — Emergency Department (HOSPITAL_COMMUNITY)
Admission: EM | Admit: 2022-04-03 | Discharge: 2022-04-04 | Disposition: A | Discharge status: Home / Self Care | Payer: Medicaid Other | Attending: Emergency Medicine | Admitting: Emergency Medicine

## 2022-04-03 DIAGNOSIS — R0789 Other chest pain: Secondary | ICD-10-CM | POA: Insufficient documentation

## 2022-04-03 LAB — CBC
HCT: 46.6 % (ref 39.0–52.0)
Hemoglobin: 14.9 g/dL (ref 13.0–17.0)
MCH: 28 pg (ref 26.0–34.0)
MCHC: 32 g/dL (ref 30.0–36.0)
MCV: 87.6 fL (ref 80.0–100.0)
Platelets: 405 10*3/uL — ABNORMAL HIGH (ref 150–400)
RBC: 5.32 MIL/uL (ref 4.22–5.81)
RDW: 14 % (ref 11.5–15.5)
WBC: 15.1 10*3/uL — ABNORMAL HIGH (ref 4.0–10.5)
nRBC: 0 % (ref 0.0–0.2)

## 2022-04-03 NOTE — ED Triage Notes (Addendum)
Patient said he is having centralized chest pain. He said it feels like air is in his chest. No vomiting or sweatiness. Pain radiates to lower back.

## 2022-04-03 NOTE — ED Provider Triage Note (Signed)
Emergency Medicine Provider Triage Evaluation Note  Jon Branch , a 21 y.o. male  was evaluated in triage.  Pt complains of chest pain.  Patient reports 3 to 4 months ago he was helping move a large wooden door when the wooden door fell onto his chest causing him to fall backwards through glass.  The patient states that ever since this occurred he has had chest pain, shortness of breath and a feeling as if he cannot swallow.  The patient states that during the last 1 month this issue was acutely worsened causing him to present to the ED.  The patient states that the pain is located centrally, does not radiate.  The patient is also complaining of shortness of breath that is not worse with exertion.  Patient denies any fevers, nausea or vomiting.  Patient denies leg swelling.  Review of Systems  Positive:  Negative:   Physical Exam  BP (!) 141/95 (BP Location: Left Arm)   Pulse 94   Temp 98.1 F (36.7 C) (Oral)   Resp 20   Ht 5\' 11"  (1.803 m)   Wt 92 kg   SpO2 100%   BMI 28.29 kg/m  Gen:   Awake, no distress   Resp:  Normal effort  MSK:   Moves extremities without difficulty  Other:    Medical Decision Making  Medically screening exam initiated at 11:42 PM.  Appropriate orders placed.  Sandy Goehring was informed that the remainder of the evaluation will be completed by another provider, this initial triage assessment does not replace that evaluation, and the importance of remaining in the ED until their evaluation is complete.     Karie Mainland, PA-C 04/03/22 2343

## 2022-04-04 ENCOUNTER — Emergency Department (HOSPITAL_COMMUNITY): Payer: Medicaid Other

## 2022-04-04 LAB — BASIC METABOLIC PANEL
Anion gap: 10 (ref 5–15)
BUN: 13 mg/dL (ref 6–20)
CO2: 25 mmol/L (ref 22–32)
Calcium: 10.3 mg/dL (ref 8.9–10.3)
Chloride: 108 mmol/L (ref 98–111)
Creatinine, Ser: 0.74 mg/dL (ref 0.61–1.24)
GFR, Estimated: >60 mL/min (ref >60)
Glucose, Bld: 147 mg/dL — ABNORMAL HIGH (ref 70–99)
Potassium: 3.8 mmol/L (ref 3.5–5.1)
Sodium: 143 mmol/L (ref 135–145)

## 2022-04-04 LAB — TROPONIN I (HIGH SENSITIVITY)
Troponin I (High Sensitivity): 2 ng/L (ref <18)
Troponin I (High Sensitivity): <2 ng/L (ref <18)

## 2022-04-04 LAB — D-DIMER, QUANTITATIVE: D-Dimer, Quant: 0.33 ug/mL-FEU (ref 0.00–0.50)

## 2022-04-04 NOTE — ED Provider Notes (Signed)
[facility]-EMERGENCY DEPT Provider Note   CSN: [id number] Arrival date & time: 04/03/22  2246     History  Chief Complaint  Patient presents with   Chest Pain    Jon Branch is a 21 y.o. male.  HPI     This is a 21 year old male who presents with chest pain.  Patient reports that he has had ongoing chest pain over the last 4 months.  He states that he was [place] his [place] when a heavy pallet landed on his chest.  Since that time he describes feeling like "air gets trapped" in his chest.  He has been seen and evaluated in the ED and by his primary physician for the same.  Prior evaluations have been largely unremarkable.  He returned to the [place] 2 weeks ago.  He has not had any fevers or cough.  No infectious symptoms.  Denies pain with deep breathing.  Denies lower extremity swelling.  Of note, patient has seen gastroenterology and also has pulmonology follow-up scheduled.  Gastroenterology work-up has been unremarkable.  He had a CT chest yesterday that had been ordered by his primary physician which was reassuring.  This was repeated in the ED by the screening provider without any change.  Home Medications Prior to Admission medications   Medication Sig Start Date End Date Taking? Authorizing Provider  benztropine (COGENTIN) 0.5 MG tablet Take 1 tablet (0.5 mg total) by mouth 2 (two) times daily. For prevention of drug induced tremors Patient not taking: Reported on 03/20/2022 10/16/18   Armandina Stammer I, NP  omega-3 acid ethyl esters (LOVAZA) 1 g capsule Take 1 capsule (1 g total) by mouth 2 (two) times daily. For high cholesterol Patient not taking: Reported on 03/20/2022 10/16/18   Armandina Stammer I, NP  Prenatal Vit-Fe Fumarate-FA (PRENATAL MULTIVITAMIN) TABS tablet Take 1 tablet by mouth daily at 12 noon. Vitamin supplement Patient not taking: Reported on 03/20/2022 10/16/18   Armandina Stammer I, NP  risperiDONE (RISPERDAL) 3 MG tablet Take 1 tablet (3 mg  total) by mouth 2 (two) times daily. For mood control Patient not taking: Reported on 03/20/2022 10/16/18   Armandina Stammer I, NP  temazepam (RESTORIL) 30 MG capsule Take 1 capsule (30 mg total) by mouth at bedtime. For sleep Patient not taking: Reported on 03/20/2022 10/16/18   Armandina Stammer I, NP      Allergies    Pork-derived products    Review of Systems   Review of Systems  Constitutional:  Negative for fever.  Respiratory:  Negative for shortness of breath.   Cardiovascular:  Positive for chest pain. Negative for leg swelling.  All other systems reviewed and are negative.   Physical Exam Updated Vital Signs BP (!) 139/96   Pulse 78   Temp 98.1 F (36.7 C) (Oral)   Resp 20   Ht 1.803 m (5\' 11" )   Wt 92 kg   SpO2 99%   BMI 28.29 kg/m  Physical Exam Vitals and nursing note reviewed.  Constitutional:      Appearance: He is well-developed.  HENT:     Head: Normocephalic and atraumatic.  Eyes:     Pupils: Pupils are equal, round, and reactive to light.  Cardiovascular:     Rate and Rhythm: Normal rate and regular rhythm.     Heart sounds: Normal heart sounds. No murmur heard. Pulmonary:     Effort: Pulmonary effort is normal. No respiratory distress.     Breath sounds: Normal  breath sounds. No wheezing.  Chest:     Chest wall: No tenderness.     Comments: No crepitus Abdominal:     General: Bowel sounds are normal.     Palpations: Abdomen is soft.     Tenderness: There is no abdominal tenderness. There is no rebound.  Musculoskeletal:     Cervical back: Neck supple.     Right lower leg: No tenderness. No edema.     Left lower leg: No tenderness. No edema.  Lymphadenopathy:     Cervical: No cervical adenopathy.  Skin:    General: Skin is warm and dry.  Neurological:     Mental Status: He is alert and oriented to person, place, and time.  Psychiatric:        Mood and Affect: Mood normal.     ED Results / Procedures / Treatments   Labs (all labs ordered are  listed, but only abnormal results are displayed) Labs Reviewed  BASIC METABOLIC PANEL - Abnormal; Notable for the following components:      Result Value   Glucose, Bld 147 (*)    All other components within normal limits  CBC - Abnormal; Notable for the following components:   WBC 15.1 (*)    Platelets 405 (*)    All other components within normal limits  D-DIMER, QUANTITATIVE  TROPONIN I (HIGH SENSITIVITY)  TROPONIN I (HIGH SENSITIVITY)    EKG EKG Interpretation  Date/Time:  Tuesday April 03 2022 23:15:52 EDT Ventricular Rate:  101 PR Interval:  143 QRS Duration: 75 QT Interval:  312 QTC Calculation: 405 R Axis:   80 Text Interpretation: Sinus tachycardia RSR' in V1 or V2, probably normal variant LVH by voltage ST elevation suggests acute pericarditis Confirmed by Ross Marcus ([id number]) on 04/04/2022 1:59:32 AM  Radiology CT Chest Wo Contrast  Result Date: 04/04/2022 CLINICAL DATA:  History of recent chest trauma with unremarkable chest x-ray, initial encounter EXAM: CT CHEST WITHOUT CONTRAST TECHNIQUE: Multidetector CT imaging of the chest was performed following the standard protocol without IV contrast. RADIATION DOSE REDUCTION: This exam was performed according to the departmental dose-optimization program which includes automated exposure control, adjustment of the mA and/or kV according to patient size and/or use of iterative reconstruction technique. COMPARISON:  Chest x-ray from earlier in the same day as well as a CT from the previous day. FINDINGS: Cardiovascular: Limited due to lack of IV contrast. Aorta is within normal limits. No cardiac enlargement is seen. Mediastinum/Nodes: Esophagus is within normal limits. No hilar or mediastinal adenopathy is noted. The thoracic inlet is within normal limits. Lungs/Pleura: Lungs are well aerated bilaterally. No focal infiltrate, effusion or pneumothorax is seen. No sizable parenchymal nodules are noted. Upper Abdomen: Visualized  upper abdomen shows no acute abnormality. Musculoskeletal: No acute rib abnormality is seen. No compression deformity is noted. IMPRESSION: No acute abnormality noted. No significant interval change from the CT obtained on the previous day. Electronically Signed   By: Alcide Clever M.D.   On: 04/04/2022 00:25   CT CHEST WO CONTRAST  Result Date: 04/03/2022 CLINICAL DATA:  Shortness of breath on exertion for several months and recent blunt trauma to the left chest, initial encounter EXAM: CT CHEST WITHOUT CONTRAST TECHNIQUE: Multidetector CT imaging of the chest was performed following the standard protocol without IV contrast. RADIATION DOSE REDUCTION: This exam was performed according to the departmental dose-optimization program which includes automated exposure control, adjustment of the mA and/or kV according to patient size and/or use of  iterative reconstruction technique. COMPARISON:  Chest x-ray from 03/15/2022 FINDINGS: Cardiovascular: Limited due to lack of IV contrast. No atherosclerotic calcifications are seen. No cardiac enlargement is noted. Thoracic aorta shows no aneurysmal dilatation. Mediastinum/Nodes: Thoracic inlet is within normal limits. No hilar or mediastinal adenopathy is noted. The esophagus is unremarkable. Lungs/Pleura: Lungs are well aerated bilaterally. No focal infiltrate or sizable effusion is noted. No parenchymal nodules are seen. Upper Abdomen: Visualized is within normal limits. Musculoskeletal: No acute bony abnormality is noted. IMPRESSION: No acute abnormality to correspond with the given clinical history. Electronically Signed   By: Alcide Clever M.D.   On: 04/03/2022 23:48   DG Chest 2 View  Result Date: 04/03/2022 CLINICAL DATA:  Left-sided chest pain for several months, initial encounter EXAM: CHEST - 2 VIEW COMPARISON:  CT from the previous day, plain film from 03/15/2022 FINDINGS: The heart size and mediastinal contours are within normal limits. Both lungs are clear.  The visualized skeletal structures are unremarkable. IMPRESSION: No active cardiopulmonary disease. Electronically Signed   By: Alcide Clever M.D.   On: 04/03/2022 23:43    Procedures Procedures    Medications Ordered in ED Medications - No data to display  ED Course/ Medical Decision Making/ A&P                           Medical Decision Making Amount and/or Complexity of Data Reviewed Labs: ordered. Radiology: ordered.   This patient presents to the ED for concern of chest pain, this involves an extensive number of treatment options, and is a complaint that carries with it a high risk of complications and morbidity.  I considered the following differential and admission for this acute, potentially life threatening condition.  The differential diagnosis includes musculoskeletal etiology, GI etiology, ACS, pneumonia, pneumothorax  MDM:    This is a 21 year old male who presents with persistent atypical chest pain symptoms.  He is overall nontoxic and vital signs are reassuring.  His physical exam is largely unremarkable.  I reviewed this chart extensively.  He has seen gastroenterology.  He recently had 2 CT scans that have been unremarkable.  Recent chest pain work-up negative.  He has not had a PE evaluation.  Sent screening D-dimer.  Today his chest x-ray, repeat CT scan, EKG, troponins and lab work-up are all reassuring.  D-dimer is negative.  Pain is very atypical.  He does not appear to have an acute medical emergency.  He has follow-up with pulmonology and gastroenterology as needed.  Will refer back to his primary physician and the specialist as there does not appear to be an acute medical emergency.  Patient was reassured.  (Labs, imaging, consults)  Labs: I Ordered, and personally interpreted labs.  The pertinent results include: Negative troponins, D-dimer, basic labs  Imaging Studies ordered: I ordered imaging studies including CT chest, x-ray I independently visualized and  interpreted imaging. I agree with the radiologist interpretation  Additional history obtained from chart review.  External records from outside source obtained and reviewed including GI and primary care evaluations  Cardiac Monitoring: The patient was maintained on a cardiac monitor.  I personally viewed and interpreted the cardiac monitored which showed an underlying rhythm of: Normal sinus rhythm  Reevaluation: After the interventions noted above, I reevaluated the patient and found that they have :improved  Social Determinants of Health: Lives independently, English as a second language  Disposition: Discharge  Co morbidities that complicate the patient evaluation  Past Medical History:  Diagnosis Date   GERD (gastroesophageal reflux disease)    Hypertension      Medicines No orders of the defined types were placed in this encounter.   I have reviewed the patients home medicines and have made adjustments as needed  Problem List / ED Course: Problem List Items Addressed This Visit   None Visit Diagnoses     Atypical chest pain    -  Primary                   Final Clinical Impression(s) / ED Diagnoses Final diagnoses:  Atypical chest pain    Rx / DC Orders ED Discharge Orders     None         Shon Baton, MD 04/04/22 [phone]

## 2022-04-04 NOTE — Discharge Instructions (Signed)
You were seen today for ongoing chest discomfort.  Your work-up today is again reassuring.  Follow-up with your primary doctor.  Follow-up with gastroenterology and pulmonology as previously scheduled.

## 2022-04-17 ENCOUNTER — Ambulatory Visit (INDEPENDENT_AMBULATORY_CARE_PROVIDER_SITE_OTHER): Payer: Medicaid Other | Admitting: Pulmonary Disease

## 2022-04-17 ENCOUNTER — Encounter: Payer: Self-pay | Admitting: Pulmonary Disease

## 2022-04-17 VITALS — BP 138/82 | HR 94 | Ht 71.0 in | Wt 210.2 lb

## 2022-04-17 DIAGNOSIS — R0789 Other chest pain: Secondary | ICD-10-CM

## 2022-04-17 DIAGNOSIS — R1310 Dysphagia, unspecified: Secondary | ICD-10-CM

## 2022-04-17 DIAGNOSIS — R0602 Shortness of breath: Secondary | ICD-10-CM | POA: Diagnosis not present

## 2022-04-17 NOTE — Patient Instructions (Addendum)
Chest tightness Shortness of breath ORDER pulmonary function test  Chest pain Liquid tylenol or motrin for pain control. As directed  Dysphagia --ORDER swallow evaluation  Follow-up with me in September after PFTs. OK to schedule PFTs on different day prior to visit

## 2022-04-17 NOTE — Progress Notes (Signed)
Subjective:   PATIENT ID: Jon Branch GENDER: male DOB: 01/25/2001, MRN: [id number]   HPI  Chief Complaint  Patient presents with   Consult    Breathing issues, 4 to 5 mo feels like something is stuck in his throat    Reason for Visit: New consult for shortness of breath  Mr. Jon Branch is a 21 year old male former smoker with schizophreniform d/o, depression who presents for evaluation for shortness of breath. Presents with mom who provides additional history. Arabic interpretor present  He reports previously normal health until several months ago. He reports difficulty breathing when taking a deep breath for the last 4-5 months. Around this time he had a cabinet fall on his chest. Was evaluated in the ED for persistent chest pain,notes reviewed with no evidence of acute/chronic injury on CT. No associated illness. In the beginning he had a nonproductive cough in the mornings but this has resolved. No wheezing. He reports he chest seems to have a lot of pressure. Does have pain with palpation. Denies personal or family history of respiratory issues. No asthma. Has reports issues swallowing sometimes that has not resolved in this same time period.  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, Systems developer, Press photographer, Arts administrator exposures: None  I have personally reviewed patient's past medical/family/social history, allergies, current medications.  Past Medical History:  Diagnosis Date   GERD (gastroesophageal reflux disease)    Hypertension      Family History  Problem Relation Age of Onset   Colon cancer Neg Hx      Social History   Occupational History   Not on file  Tobacco Use   Smoking status: Former    Types: Cigarettes   Smokeless tobacco: Never  Vaping Use   Vaping Use: Former  Substance and Sexual Activity    Alcohol use: Never   Drug use: Not Currently    Types: Marijuana   Sexual activity: Not Currently    Allergies  Allergen Reactions   Pork-Derived Products      Outpatient Medications Prior to Visit  Medication Sig Dispense Refill   benztropine (COGENTIN) 0.5 MG tablet Take 1 tablet (0.5 mg total) by mouth 2 (two) times daily. For prevention of drug induced tremors (Patient not taking: Reported on 03/20/2022) 60 tablet 0   omega-3 acid ethyl esters (LOVAZA) 1 g capsule Take 1 capsule (1 g total) by mouth 2 (two) times daily. For high cholesterol (Patient not taking: Reported on 03/20/2022) 30 capsule 0   Prenatal Vit-Fe Fumarate-FA (PRENATAL MULTIVITAMIN) TABS tablet Take 1 tablet by mouth daily at 12 noon. Vitamin supplement (Patient not taking: Reported on 03/20/2022) 1 tablet 0   risperiDONE (RISPERDAL) 3 MG tablet Take 1 tablet (3 mg total) by mouth 2 (two) times daily. For mood control (Patient not taking: Reported on 03/20/2022) 30 tablet 0   temazepam (RESTORIL) 30 MG capsule Take 1 capsule (30 mg total) by mouth at bedtime. For sleep (Patient not taking: Reported on 03/20/2022) 30 capsule 0   No facility-administered medications prior to visit.    Review of Systems  Constitutional:  Negative for chills, diaphoresis, fever, malaise/fatigue and weight loss.  HENT:  Negative for congestion.   Respiratory:  Positive for shortness of breath. Negative for cough, hemoptysis, sputum production and wheezing.   Cardiovascular:  Negative for chest pain, palpitations and  leg swelling.     Objective:   Vitals:   04/17/22 1547  BP: 138/82  Pulse: 94  SpO2: 98%  Weight: 210 lb 3.2 oz (95.3 kg)  Height: 5\' 11"  (1.803 m)     Physical Exam: General: Well-appearing, no acute distress HENT: [place], AT Eyes: EOMI, no scleral icterus Respiratory: Clear to auscultation bilaterally.  No crackles, wheezing or rales Cardiovascular: RRR, -M/R/G, no JVD Extremities:-Edema,-tenderness Neuro: AAO x4,  CNII-XII grossly intact Psych: Normal mood, normal affect Musculoskeletal: Left sided chest pain reproducible with deep palpation  Data Reviewed:  Imaging: CT Chest 04/04/22 - Normal parenchymal  PFT: None on file  Labs: CBC    Component Value Date/Time   WBC 15.1 (H) 04/03/2022 2330   RBC 5.32 04/03/2022 2330   HGB 14.9 04/03/2022 2330   HCT 46.6 04/03/2022 2330   PLT 405 (H) 04/03/2022 2330   MCV 87.6 04/03/2022 2330   MCH 28.0 04/03/2022 2330   MCHC 32.0 04/03/2022 2330   RDW 14.0 04/03/2022 2330   LYMPHSABS 2.3 10/05/2021 1238   MONOABS 1.1 (H) 10/05/2021 1238   EOSABS 0.1 10/05/2021 1238   BASOSABS 0.1 10/05/2021 1238   Absolute eos 10/05/21 - 100     Assessment & Plan:   Discussion: 21 year old male former smoker with schizophreniform d/o, depression who presents for evaluation for shortness of breath and dysphagia. Atypical symptoms but will rule out underlying lung disease. Will also plan for evaluate with swallow evaluation +/- GI referral pending results.   Chest tightness Shortness of breath ORDER pulmonary function test  Chest pain, suspect musculoskeletal Liquid tylenol or motrin for pain control. As directed  Dysphagia --ORDER swallow evaluation  Health Maintenance  There is no immunization history on file for this patient.   Orders Placed This Encounter  Procedures   SLP clinical swallow evaluation    Standing Status:   Future    Standing Expiration Date:   04/18/2023   Pulmonary function test    Standing Status:   Future    Standing Expiration Date:   04/18/2023    Order Specific Question:   Where should this test be performed?    Answer:   [facility]    Order Specific Question:   Full PFT: includes the following: basic spirometry, spirometry pre & post bronchodilator, diffusion capacity (DLCO), lung volumes    Answer:   Full PFT  No orders of the defined types were placed in this encounter.   No follow-ups on file. After  PFTs  I have spent a total time of 45-minutes on the day of the appointment reviewing prior documentation, coordinating care and discussing medical diagnosis and plan with the patient/family. Imaging, labs and tests included in this note have been reviewed and interpreted independently by me.  Sentoria Brent 04/20/2023, MD [facility] 04/17/2022 12:06 PM  Office Number [phone]

## 2022-04-23 ENCOUNTER — Encounter: Payer: Self-pay | Admitting: Pulmonary Disease

## 2022-04-30 ENCOUNTER — Telehealth: Payer: Self-pay | Admitting: Pulmonary Disease

## 2022-04-30 DIAGNOSIS — R1310 Dysphagia, unspecified: Secondary | ICD-10-CM

## 2022-04-30 NOTE — Telephone Encounter (Signed)
Order was put in for SLP Clinical Swallow Evaluation.  Per Dahlia Client in acute rehab this needs to be put in as a referral to Speech Therapy please.

## 2022-04-30 NOTE — Addendum Note (Signed)
Addended by: Geraldo Docker on: 04/30/2022 03:43 PM   Modules accepted: Orders

## 2022-06-07 NOTE — Telephone Encounter (Signed)
Referral placed.

## 2022-07-14 ENCOUNTER — Other Ambulatory Visit: Payer: Self-pay

## 2022-07-14 ENCOUNTER — Encounter (HOSPITAL_COMMUNITY): Payer: Self-pay | Admitting: Emergency Medicine

## 2022-07-14 ENCOUNTER — Emergency Department (HOSPITAL_COMMUNITY)
Admission: EM | Admit: 2022-07-14 | Discharge: 2022-07-14 | Disposition: A | Discharge status: Home / Self Care | Payer: Medicaid Other | Attending: Student | Admitting: Student

## 2022-07-14 DIAGNOSIS — I10 Essential (primary) hypertension: Secondary | ICD-10-CM | POA: Diagnosis not present

## 2022-07-14 DIAGNOSIS — M542 Cervicalgia: Secondary | ICD-10-CM | POA: Diagnosis present

## 2022-07-14 DIAGNOSIS — M436 Torticollis: Secondary | ICD-10-CM | POA: Diagnosis not present

## 2022-07-14 LAB — COMPREHENSIVE METABOLIC PANEL
ALT: 16 U/L (ref 0–44)
AST: 26 U/L (ref 15–41)
Albumin: 4.1 g/dL (ref 3.5–5.0)
Alkaline Phosphatase: 83 U/L (ref 38–126)
Anion gap: 13 (ref 5–15)
BUN: 13 mg/dL (ref 6–20)
CO2: 26 mmol/L (ref 22–32)
Calcium: 10 mg/dL (ref 8.9–10.3)
Chloride: 103 mmol/L (ref 98–111)
Creatinine, Ser: 0.76 mg/dL (ref 0.61–1.24)
GFR, Estimated: >60 mL/min (ref >60)
Glucose, Bld: 122 mg/dL — ABNORMAL HIGH (ref 70–99)
Potassium: 4 mmol/L (ref 3.5–5.1)
Sodium: 142 mmol/L (ref 135–145)
Total Bilirubin: 1 mg/dL (ref 0.3–1.2)
Total Protein: 7.6 g/dL (ref 6.5–8.1)

## 2022-07-14 LAB — CBC WITH DIFFERENTIAL/PLATELET
Abs Immature Granulocytes: 0.04 10*3/uL (ref 0.00–0.07)
Basophils Absolute: 0.1 10*3/uL (ref 0.0–0.1)
Basophils Relative: 1 %
Eosinophils Absolute: 0.1 10*3/uL (ref 0.0–0.5)
Eosinophils Relative: 1 %
HCT: 44.5 % (ref 39.0–52.0)
Hemoglobin: 14.2 g/dL (ref 13.0–17.0)
Immature Granulocytes: 0 %
Lymphocytes Relative: 18 %
Lymphs Abs: 2.6 10*3/uL (ref 0.7–4.0)
MCH: 28 pg (ref 26.0–34.0)
MCHC: 31.9 g/dL (ref 30.0–36.0)
MCV: 87.6 fL (ref 80.0–100.0)
Monocytes Absolute: 1.2 10*3/uL — ABNORMAL HIGH (ref 0.1–1.0)
Monocytes Relative: 8 %
Neutro Abs: 10.6 10*3/uL — ABNORMAL HIGH (ref 1.7–7.7)
Neutrophils Relative %: 72 %
Platelets: 420 10*3/uL — ABNORMAL HIGH (ref 150–400)
RBC: 5.08 MIL/uL (ref 4.22–5.81)
RDW: 12.8 % (ref 11.5–15.5)
WBC: 14.6 10*3/uL — ABNORMAL HIGH (ref 4.0–10.5)
nRBC: 0 % (ref 0.0–0.2)

## 2022-07-14 MED ORDER — LIDOCAINE 5 % EX PTCH
1.0000 | MEDICATED_PATCH | CUTANEOUS | Status: DC
Start: 2022-07-14 — End: 2022-07-14
  Administered 2022-07-14: 1 via TRANSDERMAL
  Filled 2022-07-14: qty 1

## 2022-07-14 MED ORDER — BACLOFEN 1 MG/ML ORAL SUSPENSION
5.0000 mg | Freq: Three times a day (TID) | ORAL | 0 refills | Status: AC
Start: 2022-07-14 — End: 2022-07-24

## 2022-07-14 NOTE — ED Provider Notes (Signed)
[facility] EMERGENCY DEPARTMENT Provider Note   CSN: [id number] Arrival date & time: 07/14/22  1346     History  Chief Complaint  Patient presents with   Neck Pain    Jon Branch is a 21 y.o. male.  21 year old male with past medical history of hypertension presents with complaint of pain on the right side of his neck onset 3 days ago, progressively worsening without fall or injury.  Denies weakness or numbness in the upper extremities.  Pain is worse with turning his head to the right. Reports history of hypertension however states he is no longer on medication because he cannot swallow pills so his doctor told him it was okay to just take a walk.       Home Medications Prior to Admission medications   Medication Sig Start Date End Date Taking? Authorizing Provider  baclofen (OZOBAX) 1 mg/mL SOLN oral solution Take 5 mLs (5 mg total) by mouth 3 (three) times daily for 10 days. 07/14/22 07/24/22 Yes Jeannie Fend, PA-C  benztropine (COGENTIN) 0.5 MG tablet Take 1 tablet (0.5 mg total) by mouth 2 (two) times daily. For prevention of drug induced tremors Patient not taking: Reported on 03/20/2022 10/16/18   Armandina Stammer I, NP  omega-3 acid ethyl esters (LOVAZA) 1 g capsule Take 1 capsule (1 g total) by mouth 2 (two) times daily. For high cholesterol Patient not taking: Reported on 03/20/2022 10/16/18   Armandina Stammer I, NP  Prenatal Vit-Fe Fumarate-FA (PRENATAL MULTIVITAMIN) TABS tablet Take 1 tablet by mouth daily at 12 noon. Vitamin supplement Patient not taking: Reported on 03/20/2022 10/16/18   Armandina Stammer I, NP  risperiDONE (RISPERDAL) 3 MG tablet Take 1 tablet (3 mg total) by mouth 2 (two) times daily. For mood control Patient not taking: Reported on 03/20/2022 10/16/18   Armandina Stammer I, NP  temazepam (RESTORIL) 30 MG capsule Take 1 capsule (30 mg total) by mouth at bedtime. For sleep Patient not taking: Reported on 03/20/2022 10/16/18   Armandina Stammer I, NP       Allergies    Losartan and Pork-derived products    Review of Systems   Review of Systems Negative except as per HPI Physical Exam Updated Vital Signs BP (!) 145/102   Pulse (!) 102   Temp 98.8 F (37.1 C) (Oral)   Resp 17   SpO2 100%  Physical Exam Vitals and nursing note reviewed.  Constitutional:      General: He is not in acute distress.    Appearance: He is well-developed. He is not diaphoretic.  HENT:     Head: Normocephalic and atraumatic.  Cardiovascular:     Pulses: Normal pulses.  Pulmonary:     Effort: Pulmonary effort is normal.  Musculoskeletal:     Cervical back: Normal range of motion. Tenderness present. No bony tenderness.     Thoracic back: No tenderness or bony tenderness.       Back:     Comments: Tenderness to the right trapezius area extending to right SCM with palpable muscle spasm.  Skin:    General: Skin is warm and dry.     Findings: No erythema or rash.  Neurological:     Mental Status: He is alert and oriented to person, place, and time.     Sensory: No sensory deficit.     Motor: No weakness.     Gait: Gait normal.  Psychiatric:        Behavior: Behavior normal.  ED Results / Procedures / Treatments   Labs (all labs ordered are listed, but only abnormal results are displayed) Labs Reviewed  COMPREHENSIVE METABOLIC PANEL - Abnormal; Notable for the following components:      Result Value   Glucose, Bld 122 (*)    All other components within normal limits  CBC WITH DIFFERENTIAL/PLATELET - Abnormal; Notable for the following components:   WBC 14.6 (*)    Platelets 420 (*)    Neutro Abs 10.6 (*)    Monocytes Absolute 1.2 (*)    All other components within normal limits    EKG None  Radiology No results found.  Procedures Procedures    Medications Ordered in ED Medications  lidocaine (LIDODERM) 5 % 1 patch (has no administration in time range)    ED Course/ Medical Decision Making/ A&P                            Medical Decision Making Amount and/or Complexity of Data Reviewed Labs: ordered.  Risk Prescription drug management.   21 year old male with complaint of right-sided neck pain worse with movement, no recent injuries or illness.  He does have tenderness in his right trapezius area extending up to the right occiput as well as tenderness along the right SCM.  He has strong radial pulses bilaterally, equal grip strength, sensation intact.  Pain is worse with palpation and range of motion of the neck.  Suspect torticollis.  He does not have midline or bony tenderness.  Plan is to apply Lidoderm patch today in the ER and will discharge with recommendation for liquid muscle relaxant if available and liquid ibuprofen.  Recommend that he check his blood pressure daily and follow-up with his primary care provider.  Discussed importance of blood pressure management.        Final Clinical Impression(s) / ED Diagnoses Final diagnoses:  Torticollis, acute  Hypertension, unspecified type    Rx / DC Orders ED Discharge Orders          Ordered    baclofen (OZOBAX) 1 mg/mL SOLN oral solution  3 times daily        07/14/22 1624              Alden Hipp 07/14/22 1627    Kommor, Wyn Forster, MD 07/15/22 [id number]

## 2022-07-14 NOTE — ED Triage Notes (Signed)
Patient pov w/ neck pain that's been going on for the last 3-5 days. Patient denies any trauma, sleeping funny on his head. Complains of a little "head pressure" but then states its normal for him. Patient states "the pain is just pain" and unable to describe it.

## 2022-07-14 NOTE — Discharge Instructions (Addendum)
Recheck with your primary care provider.  If you do not have a primary care provider, please follow-up with resources provided.  You can apply over-the-counter lidocaine patches to your neck, these are available at the pharmacy. Also recommend children's ibuprofen, this is a liquid, you can take 600 mg 3 times a day for pain. Baclofen liquid prescription for muscle tightness. Take this prescription to your pharmacy.  Recommend a warm compress to your neck for 20 minutes followed with gentle stretching.   ??? ?????? ?? ???? ??????? ??????? ????? ??. ??? ?? ??? ???? ???? ????? ?????? ????? ???????? ???????? ??????? ????????.  ????? ??? ?????? ????????? ??????? ??? ???? ???? ??? ?????? ??? ?????? ?? ????????. ???? ????? ??????????? ???????? ??? ????? ????? ????? 600 ??? 3 ???? ?????? ????? ?????. ???? ???? ???????? ??? ???????. ?? ??? ?????? ?????? ??? ???????? ?????? ??.  ???? ???? ????? ????? ??? ????? ???? 20 ????? ?????? ??????? ?????? ???????. 'aeada altahaquq mae maqadam alrieayat al'awaliat alkhasi bika. 'iidha lam yakun ladayk muqadam rieayat 'awaliatin, fayurjaa almutabaeat biaistikhdam almawarid almutawafirati. [place] wade lasiqat lidukayiyn almutahat dun wasfat tibiyat ealaa raqabatika, wahi mutawafirat fi alsaydaliati. 'uwasiy aydan bi'iibubrufin al'atfali, wahu sayili, [place] tanawul 600 majam 3 maraat ywmyan lieilaj al'almi. wasfat sayil baklufin lishadi aleadalati. Tomma Rakers hadhih alwasfat altibiyat 'iilaa alsaydaliat alkhasat bika. yuasaa biwade kamadat dafiat ealaa raqabatik limudat 20 daqiqat mutabawieat bitamarin altamadud allatifati.

## 2022-07-23 ENCOUNTER — Emergency Department (HOSPITAL_COMMUNITY)
Admission: EM | Admit: 2022-07-23 | Discharge: 2022-07-25 | Disposition: A | Discharge status: Other Acute Inpatient Hospital | Payer: Medicaid Other | Attending: Emergency Medicine | Admitting: Emergency Medicine

## 2022-07-23 DIAGNOSIS — R45850 Homicidal ideations: Secondary | ICD-10-CM | POA: Diagnosis not present

## 2022-07-23 DIAGNOSIS — F489 Nonpsychotic mental disorder, unspecified: Secondary | ICD-10-CM

## 2022-07-23 DIAGNOSIS — Z1152 Encounter for screening for COVID-19: Secondary | ICD-10-CM | POA: Insufficient documentation

## 2022-07-23 DIAGNOSIS — F1210 Cannabis abuse, uncomplicated: Secondary | ICD-10-CM | POA: Insufficient documentation

## 2022-07-23 DIAGNOSIS — Z87891 Personal history of nicotine dependence: Secondary | ICD-10-CM | POA: Diagnosis not present

## 2022-07-23 DIAGNOSIS — F12188 Cannabis abuse with other cannabis-induced disorder: Secondary | ICD-10-CM | POA: Insufficient documentation

## 2022-07-23 DIAGNOSIS — F1994 Other psychoactive substance use, unspecified with psychoactive substance-induced mood disorder: Secondary | ICD-10-CM | POA: Diagnosis not present

## 2022-07-23 DIAGNOSIS — F99 Mental disorder, not otherwise specified: Secondary | ICD-10-CM | POA: Insufficient documentation

## 2022-07-23 LAB — CBC WITH DIFFERENTIAL/PLATELET
Abs Immature Granulocytes: 0.07 10*3/uL (ref 0.00–0.07)
Basophils Absolute: 0.1 10*3/uL (ref 0.0–0.1)
Basophils Relative: 1 %
Eosinophils Absolute: 0.2 10*3/uL (ref 0.0–0.5)
Eosinophils Relative: 1 %
HCT: 46.6 % (ref 39.0–52.0)
Hemoglobin: 14.9 g/dL (ref 13.0–17.0)
Immature Granulocytes: 0 %
Lymphocytes Relative: 29 %
Lymphs Abs: 5 10*3/uL — ABNORMAL HIGH (ref 0.7–4.0)
MCH: 27.9 pg (ref 26.0–34.0)
MCHC: 32 g/dL (ref 30.0–36.0)
MCV: 87.3 fL (ref 80.0–100.0)
Monocytes Absolute: 1.1 10*3/uL — ABNORMAL HIGH (ref 0.1–1.0)
Monocytes Relative: 7 %
Neutro Abs: 10.5 10*3/uL — ABNORMAL HIGH (ref 1.7–7.7)
Neutrophils Relative %: 62 %
Platelets: 446 10*3/uL — ABNORMAL HIGH (ref 150–400)
RBC: 5.34 MIL/uL (ref 4.22–5.81)
RDW: 12.8 % (ref 11.5–15.5)
WBC: 17 10*3/uL — ABNORMAL HIGH (ref 4.0–10.5)
nRBC: 0 % (ref 0.0–0.2)

## 2022-07-23 LAB — COMPREHENSIVE METABOLIC PANEL
ALT: 20 U/L (ref 0–44)
AST: 20 U/L (ref 15–41)
Albumin: 4.2 g/dL (ref 3.5–5.0)
Alkaline Phosphatase: 95 U/L (ref 38–126)
Anion gap: 11 (ref 5–15)
BUN: 9 mg/dL (ref 6–20)
CO2: 23 mmol/L (ref 22–32)
Calcium: 9.5 mg/dL (ref 8.9–10.3)
Chloride: 103 mmol/L (ref 98–111)
Creatinine, Ser: 0.88 mg/dL (ref 0.61–1.24)
GFR, Estimated: >60 mL/min (ref >60)
Glucose, Bld: 107 mg/dL — ABNORMAL HIGH (ref 70–99)
Potassium: 3.5 mmol/L (ref 3.5–5.1)
Sodium: 137 mmol/L (ref 135–145)
Total Bilirubin: 0.2 mg/dL — ABNORMAL LOW (ref 0.3–1.2)
Total Protein: 8.1 g/dL (ref 6.5–8.1)

## 2022-07-23 LAB — RAPID URINE DRUG SCREEN, HOSP PERFORMED
Amphetamines: NOT DETECTED
Barbiturates: NOT DETECTED
Benzodiazepines: NOT DETECTED
Cocaine: NOT DETECTED
Opiates: NOT DETECTED
Tetrahydrocannabinol: NOT DETECTED

## 2022-07-23 LAB — SALICYLATE LEVEL: Salicylate Lvl: <7 mg/dL — ABNORMAL LOW (ref 7.0–30.0)

## 2022-07-23 LAB — ACETAMINOPHEN LEVEL: Acetaminophen (Tylenol), Serum: <10 ug/mL — ABNORMAL LOW (ref 10–30)

## 2022-07-23 LAB — ETHANOL: Alcohol, Ethyl (B): <10 mg/dL (ref <10)

## 2022-07-23 NOTE — ED Provider Triage Note (Signed)
Emergency Medicine Provider Triage Evaluation Note  Jon Branch , a 21 y.o. male  was evaluated in triage.  Pt complains of having difficulty moving. Patient is elusive in triage. Demanding to speak to a male doctor. He is oriented. Denies pain. Denies SI/HI/AVH. States he does not take any medications. Went to primary care today and sent here. Mother in the room writes on her phone "send him to a mental hospital...threatens to kill [place]."   Review of Systems  Positive: See above Negative:   Physical Exam  BP (!) 166/107 (BP Location: Right Arm)   Pulse 91   Temp 98.3 F (36.8 C)   Resp 18   SpO2 99%  Gen:   Awake, no distress   Resp:  Normal effort  MSK:   Moves extremities without difficulty  Other:    Medical Decision Making  Medically screening exam initiated at 7:42 PM.  Appropriate orders placed.  Jon Branch was informed that the remainder of the evaluation will be completed by another provider, this initial triage assessment does not replace that evaluation, and the importance of remaining in the ED until their evaluation is complete.  Patient refuses physical exam    Jon Peru, PA-C 07/23/22 1948

## 2022-07-23 NOTE — ED Notes (Signed)
Pt mother requesting updates on POC  Mom [phone]

## 2022-07-23 NOTE — ED Notes (Signed)
IVC paperwork complete and in orange zone, expires 07/24/22

## 2022-07-23 NOTE — ED Triage Notes (Signed)
Pt to ED accompanied with mom, pt not cooperative with this RN not forth coming with information.requesting to "speak with a man"  Pt mother shows this RN her phone with text saying "YOU SHOULD SEND HIM TO A MENTAL HOSPITAL BECAUSE HS IS A MENTALLY ILL MAN WHOS BAD AND THREATENS TO KILL [place]"

## 2022-07-24 DIAGNOSIS — F1994 Other psychoactive substance use, unspecified with psychoactive substance-induced mood disorder: Secondary | ICD-10-CM

## 2022-07-24 DIAGNOSIS — F1210 Cannabis abuse, uncomplicated: Secondary | ICD-10-CM | POA: Insufficient documentation

## 2022-07-24 MED ORDER — RISPERIDONE 1 MG PO TBDP
1.0000 mg | ORAL_TABLET | Freq: Every day | ORAL | Status: DC
  Administered 2022-07-24: 1 mg via ORAL
  Filled 2022-07-24: qty 1

## 2022-07-24 NOTE — ED Notes (Signed)
Ivc paperwork is now in blue zone

## 2022-07-24 NOTE — Progress Notes (Addendum)
Pt was accepted to [facility] TOMORROW 12/6/3; [facility]  Pt meets inpatient criteria per Eligha Bridegroom, NP   Attending Physician will be Dr. Betti Cruz  Report can be called to: [phone]  Pt can arrive after 8:00am  Care Team notified: Adonis Brook, RN  Kelton Pillar, LCSWA 07/24/2022 @ 11:37 PM

## 2022-07-24 NOTE — ED Provider Notes (Signed)
[facility] EMERGENCY DEPARTMENT Provider Note   CSN: [id number] Arrival date & time: 07/23/22  1807     History  Chief Complaint  Patient presents with   Mental Health Problem   Homicidal    Jon Branch is a 21 y.o. male.  21 year old male with prior medical history as detailed below presents for evaluation.  Patient presented in triage with his mother.  Apparently mother communicated to triage providers that the patient was threatening to harm family members.  IVC hold initiated in triage.  At the time of my evaluation, the patient is calm and cooperative.  He denies any specific complaint.  He is oriented appropriately.  He denies thoughts of self-harm or homicidality.  Patient is aware of IVC hold and need for mental health evaluation.  The history is provided by the patient and medical records.       Home Medications Prior to Admission medications   Medication Sig Start Date End Date Taking? Authorizing Provider  baclofen (OZOBAX) 1 mg/mL SOLN oral solution Take 5 mLs (5 mg total) by mouth 3 (three) times daily for 10 days. 07/14/22 07/24/22  Jeannie Fend, PA-C  benztropine (COGENTIN) 0.5 MG tablet Take 1 tablet (0.5 mg total) by mouth 2 (two) times daily. For prevention of drug induced tremors Patient not taking: Reported on 03/20/2022 10/16/18   Armandina Stammer I, NP  omega-3 acid ethyl esters (LOVAZA) 1 g capsule Take 1 capsule (1 g total) by mouth 2 (two) times daily. For high cholesterol Patient not taking: Reported on 03/20/2022 10/16/18   Armandina Stammer I, NP  Prenatal Vit-Fe Fumarate-FA (PRENATAL MULTIVITAMIN) TABS tablet Take 1 tablet by mouth daily at 12 noon. Vitamin supplement Patient not taking: Reported on 03/20/2022 10/16/18   Armandina Stammer I, NP  risperiDONE (RISPERDAL) 3 MG tablet Take 1 tablet (3 mg total) by mouth 2 (two) times daily. For mood control Patient not taking: Reported on 03/20/2022 10/16/18   Armandina Stammer I, NP  temazepam (RESTORIL) 30  MG capsule Take 1 capsule (30 mg total) by mouth at bedtime. For sleep Patient not taking: Reported on 03/20/2022 10/16/18   Armandina Stammer I, NP      Allergies    Losartan and Pork-derived products    Review of Systems   Review of Systems  All other systems reviewed and are negative.   Physical Exam Updated Vital Signs BP (!) 142/92 (BP Location: Right Leg)   Pulse 89   Temp 98.8 F (37.1 C) (Oral)   Resp 18   Ht 5\' 11"  (1.803 m)   SpO2 98%   BMI 29.32 kg/m  Physical Exam Vitals and nursing note reviewed.  Constitutional:      General: He is not in acute distress.    Appearance: Normal appearance. He is well-developed.  HENT:     Head: Normocephalic and atraumatic.  Eyes:     Conjunctiva/sclera: Conjunctivae normal.     Pupils: Pupils are equal, round, and reactive to light.  Cardiovascular:     Rate and Rhythm: Normal rate and regular rhythm.     Heart sounds: Normal heart sounds.  Pulmonary:     Effort: Pulmonary effort is normal. No respiratory distress.     Breath sounds: Normal breath sounds.  Abdominal:     General: There is no distension.     Palpations: Abdomen is soft.     Tenderness: There is no abdominal tenderness.  Musculoskeletal:        General: No  deformity. Normal range of motion.     Cervical back: Normal range of motion and neck supple.  Skin:    General: Skin is warm and dry.  Neurological:     General: No focal deficit present.     Mental Status: He is alert and oriented to person, place, and time. Mental status is at baseline.     ED Results / Procedures / Treatments   Labs (all labs ordered are listed, but only abnormal results are displayed) Labs Reviewed  COMPREHENSIVE METABOLIC PANEL - Abnormal; Notable for the following components:      Result Value   Glucose, Bld 107 (*)    Total Bilirubin 0.2 (*)    All other components within normal limits  CBC WITH DIFFERENTIAL/PLATELET - Abnormal; Notable for the following components:   WBC  17.0 (*)    Platelets 446 (*)    Neutro Abs 10.5 (*)    Lymphs Abs 5.0 (*)    Monocytes Absolute 1.1 (*)    All other components within normal limits  ACETAMINOPHEN LEVEL - Abnormal; Notable for the following components:   Acetaminophen (Tylenol), Serum <10 (*)    All other components within normal limits  SALICYLATE LEVEL - Abnormal; Notable for the following components:   Salicylate Lvl <7.0 (*)    All other components within normal limits  ETHANOL  RAPID URINE DRUG SCREEN, HOSP PERFORMED    EKG None  Radiology No results found.  Procedures Procedures    Medications Ordered in ED Medications - No data to display  ED Course/ Medical Decision Making/ A&P                           Medical Decision Making   Medical Screen Complete  This patient presented to the ED with complaint of mental health evaluation.  This complaint involves an extensive number of treatment options. The initial differential diagnosis includes, but is not limited to, homicidal ideation, suicidal ideation, psychosis, etc.  This presentation is: Acute, Chronic, Self-Limited, Previously Undiagnosed, Uncertain Prognosis, Complicated, Systemic Symptoms, and Threat to Life/Bodily Function  Patient presented with family members who reported that patient has been threatening to harm or kill family.  IVC hold initiated in triage in the ED.  Patient is comfortable and cooperative at time of my evaluation.  He denies SI or HI at this time.    Screening labs are without significant abnormality.  Patient is medically clear for pending psychiatric evaluation.  Final disposition dependent upon psychiatric plan of care.    Additional history obtained:  External records from outside sources obtained and reviewed including prior ED visits and prior Inpatient records.    Lab Tests:  I ordered and personally interpreted labs.  The pertinent results include: CBC, CMP, salicylate, EtOH, acetaminophen,  urine tox Problem List / ED Course:  Mental health evaluation   Reevaluation:  After the interventions noted above, I reevaluated the patient and found that they have: stayed the same  Disposition:  After consideration of the diagnostic results and the patients response to treatment, I feel that the patent would benefit from psychiatric evaluation.          Final Clinical Impression(s) / ED Diagnoses Final diagnoses:  Mental health problem    Rx / DC Orders ED Discharge Orders     None         Wynetta Fines, MD 07/24/22 1447

## 2022-07-24 NOTE — Consult Note (Signed)
[facility] ED ASSESSMENT   Reason for Consult:  Eval Referring Physician:  Dr. Rodena Medin Patient Identification: Jon Branch MRN:  [id number] ED Chief Complaint: Substance induced mood disorder (HCC)  Diagnosis:  Principal Problem:   Substance induced mood disorder (HCC) Active Problems:   Cannabis abuse   ED Assessment Time Calculation: Start Time: 1400 Stop Time: 1445 Total Time in Minutes (Assessment Completion): 45   HPI:   Jon Branch is a 21 y.o. male patient who presented to [facility], ED accompanied with his mother.  Patient originally not willing to speak with triage team, Requesting to speak to a man due to him not wanting to speak to a woman.  Mother stated to triage nurse to "send him to a mental hospital because he is mentally ill man who is bad and threatens to kill [place]."  [facility].  Patient recently had outpatient visit on 07/23/2022 where his mother reported that the patient has been homicidal and threatening to kill them.  Dr. Jamison Oka documented yesterday on 07/23/22 at 1548  "Patient is here for an acute visit. He called this afternoon and we were able to bring get he. He was accompanied by his mother who does not speak [place]. Patient is 21 years old and understands and speaks [place]. He has a history of schizophreniform disorder , depression with psychotic features and hypertension. He traveled to [place] earlier this year and returned in July , upon his return he had multiple somatic symptoms including difficulty swallowing, chest pain and difficulty breathing. They have all been evaluated thoroughly with an EGD, CT scan of the chest and pulmonary consult. Despite all these reassurance, patient has not been taking his medications including Depakote 500 mg extended release at bedtime. He has not followed up with a psychiatrist. I spoke with patient's mother outside of the room using the [employer] Arabic interpreter. His  mother reported that patient has become homicidal, threatening to kill his parents. I told her that she needs to take a him to the emergency room for evaluation and possibly involuntary commitment. He is at risk to himself and to others. I explained to patient that he will need to go to the emergency room to be evaluated. I tried calling patient's father but he was not available. Telephone number [phone]"  Subjective:   Patient seen at [facility], ED for face-to-face evaluation.  His mother is present, and the patient stated his mother cannot stay for the assessment.  Arabic translator was used for the entirety of assessment, [place] #[id number].   Patient continues to be withholding of information.  Patient denies any suicidal or homicidal ideations.  When asked if patient made any threatening statements to his family he stated "I only say things I never commit anything with my hands. What's wrong with me saying things." I attempted to explain to the patient that when he is upset and makes homicidal statements to his family or people in general, that is scary and makes people concerned. Pt did not grasp this concept, and stated "this is the difference between man and woman. Woman doesn't understand anything." Pt denies any auditory of visual hallucinations. He reports sleeping well at night and no problems with appetite. He does endorse smoking marijuana for the first time yesterday. However, his UDS is negative for THC. He denies any other illicit substances or alcohol use. Pt does not give me any information about what triggers him at home  to become upset or make homicidal threats. He keeps stating "I wont understand due to being a woman." Pt reports high school education, and previously had a job. Pt stated he recently quit job, and when asked why responded with "I was just done with it. Don't want to do it anymore." Pt would not give me any more information stating he would like to speak with a male  provider.   Mother does inform me patient has been more aggressive than usual, and she does not feel like he is at his baseline. She does endorse patient having troubles with anger and impulsivity, however she indicates patient has never made homicidal threats before. She is worried and does not feel safe with him at the home right now. She reports he is not medication compliant, quit his job, and "doesn't know what he's going to do." Mother reports when he was taking medications daily he was much more calm and his "normal self."   Will recommend for inpatient psychiatric treatment. At this time, unsure if his preference for males is cultural or paranoid/delusional in nature. Mother does report increase in aggression and threats that are not at baseline. Pt was withholding of information, and has not been compliant with medications. His outpatient provider who knows him well referred to hospital. I do think patient could benefit Risperidone due to his aggression and impulse. I do not want to restart Depakote at this time, due to patient's history of noncompliance and the maintenance that Depakote requires.   Past Psychiatric History:  Schizophreniform disorder, GAD  Risk to Self or Others: Is the patient at risk to self? Yes Has the patient been a risk to self in the past 6 months? No Has the patient been a risk to self within the distant past? No Is the patient a risk to others? Yes Has the patient been a risk to others in the past 6 months? No Has the patient been a risk to others within the distant past? No  [place] Scale:  Flowsheet Row ED from 07/23/2022 in [facility] EMERGENCY DEPART[facility] ED from 07/14/2022 in [facility] EMERGENCY DEPARTMENT ED from 04/03/2022 in [facility]  C-SSRS RISK CATEGORY No Risk No Risk No Risk      Past Medical History:  Past Medical History:  Diagnosis Date   GERD (gastroesophageal reflux  disease)    Hypertension    No past surgical history on file. Family History:  Family History  Problem Relation Age of Onset   High blood pressure Mother    Diabetes Mother    High blood pressure Father    High blood pressure Maternal Grandmother    Diabetes Maternal Grandmother    High blood pressure Paternal Grandmother    Colon cancer Neg Hx    Social History:  Social History   Substance and Sexual Activity  Alcohol Use Never     Social History   Substance and Sexual Activity  Drug Use Not Currently   Types: Marijuana    Social History   Socioeconomic History   Marital status: Single    Spouse name: Not on file   Number of children: Not on file   Years of education: Not on file   Highest education level: Not on file  Occupational History   Not on file  Tobacco Use   Smoking status: Former    Types: Cigarettes   Smokeless tobacco: Never  Vaping Use   Vaping Use: Former  Substances: Flavoring  Substance and Sexual Activity   Alcohol use: Never   Drug use: Not Currently    Types: Marijuana   Sexual activity: Not Currently  Other Topics Concern   Not on file  Social History Narrative   ** Merged History Encounter **       Social Determinants of Health   Financial Resource Strain: Not on file  Food Insecurity: Not on file  Transportation Needs: Not on file  Physical Activity: Not on file  Stress: Not on file  Social Connections: Not on file   Additional Social History:    Allergies:   Allergies  Allergen Reactions   Losartan Swelling   Pork-Derived Products     Labs:  Results for orders placed or performed during the hospital encounter of 07/23/22 (from the past 48 hour(s))  Comprehensive metabolic panel     Status: Abnormal   Collection Time: 07/23/22 10:05 PM  Result Value Ref Range   Sodium 137 135 - 145 mmol/L   Potassium 3.5 3.5 - 5.1 mmol/L   Chloride 103 98 - 111 mmol/L   CO2 23 22 - 32 mmol/L   Glucose, Bld 107 (H) 70 - 99 mg/dL     Comment: Glucose reference range applies only to samples taken after fasting for at least 8 hours.   BUN 9 6 - 20 mg/dL   Creatinine, Ser 0.09 0.61 - 1.24 mg/dL   Calcium 9.5 8.9 - 38.1 mg/dL   Total Protein 8.1 6.5 - 8.1 g/dL   Albumin 4.2 3.5 - 5.0 g/dL   AST 20 15 - 41 U/L   ALT 20 0 - 44 U/L   Alkaline Phosphatase 95 38 - 126 U/L   Total Bilirubin 0.2 (L) 0.3 - 1.2 mg/dL   GFR, Estimated >82 >99 mL/min    Comment: (NOTE) Calculated using the CKD-EPI Creatinine Equation (2021)    Anion gap 11 5 - 15    Comment: Performed at [facility], [address] [place], [place] [address]  Ethanol     Status: None   Collection Time: 07/23/22 10:05 PM  Result Value Ref Range   Alcohol, Ethyl (B) <10 <10 mg/dL    Comment: (NOTE) Lowest detectable limit for serum alcohol is 10 mg/dL.  For medical purposes only. Performed at [facility], [address], [place], [place] [address]   CBC with Diff     Status: Abnormal   Collection Time: 07/23/22 10:05 PM  Result Value Ref Range   WBC 17.0 (H) 4.0 - 10.5 K/uL   RBC 5.34 4.22 - 5.81 MIL/uL   Hemoglobin 14.9 13.0 - 17.0 g/dL   HCT 81.0 17.5 - 10.2 %   MCV 87.3 80.0 - 100.0 fL   MCH 27.9 26.0 - 34.0 pg   MCHC 32.0 30.0 - 36.0 g/dL   RDW 58.5 27.7 - 82.4 %   Platelets 446 (H) 150 - 400 K/uL   nRBC 0.0 0.0 - 0.2 %   Neutrophils Relative % 62 %   Neutro Abs 10.5 (H) 1.7 - 7.7 K/uL   Lymphocytes Relative 29 %   Lymphs Abs 5.0 (H) 0.7 - 4.0 K/uL   Monocytes Relative 7 %   Monocytes Absolute 1.1 (H) 0.1 - 1.0 K/uL   Eosinophils Relative 1 %   Eosinophils Absolute 0.2 0.0 - 0.5 K/uL   Basophils Relative 1 %   Basophils Absolute 0.1 0.0 - 0.1 K/uL   Immature Granulocytes 0 %  Abs Immature Granulocytes 0.07 0.00 - 0.07 K/uL    Comment: Performed at [facility], [address] [place], [place] [address]  Acetaminophen level     Status: Abnormal   Collection Time: 07/23/22 10:05 PM  Result Value Ref Range    Acetaminophen (Tylenol), Serum <10 (L) 10 - 30 ug/mL    Comment: (NOTE) Therapeutic concentrations vary significantly. A range of 10-30 ug/mL  may be an effective concentration for many patients. However, some  are best treated at concentrations outside of this range. Acetaminophen concentrations >150 ug/mL at 4 hours after ingestion  and >50 ug/mL at 12 hours after ingestion are often associated with  toxic reactions.  Performed at [facility], [address] [place], [place] [address]   Salicylate level     Status: Abnormal   Collection Time: 07/23/22 10:05 PM  Result Value Ref Range   Salicylate Lvl <7.0 (L) 7.0 - 30.0 mg/dL    Comment: Performed at [facility], [address] [place], [place] [address]  Urine rapid drug screen (hosp performed)     Status: None   Collection Time: 07/23/22 11:00 PM  Result Value Ref Range   Opiates NONE DETECTED NONE DETECTED   Cocaine NONE DETECTED NONE DETECTED   Benzodiazepines NONE DETECTED NONE DETECTED   Amphetamines NONE DETECTED NONE DETECTED   Tetrahydrocannabinol NONE DETECTED NONE DETECTED   Barbiturates NONE DETECTED NONE DETECTED    Comment: (NOTE) DRUG SCREEN FOR MEDICAL PURPOSES ONLY.  IF CONFIRMATION IS NEEDED FOR ANY PURPOSE, NOTIFY LAB WITHIN 5 DAYS.  LOWEST DETECTABLE LIMITS FOR URINE DRUG SCREEN Drug Class                     Cutoff (ng/mL) Amphetamine and metabolites    1000 Barbiturate and metabolites    200 Benzodiazepine                 200 Opiates and metabolites        300 Cocaine and metabolites        300 THC                            50 Performed at [facility], [address] [place], [place] [address]     Current Facility-Administered Medications  Medication Dose Route Frequency Provider Last Rate Last Admin   risperiDONE (RISPERDAL M-TABS) disintegrating tablet 1 mg  1 mg Oral QHS Eligha Bridegroomoleman, Kaylan Friedmann, NP       Current Outpatient Medications  Medication Sig Dispense Refill    baclofen (OZOBAX) 1 mg/mL SOLN oral solution Take 5 mLs (5 mg total) by mouth 3 (three) times daily for 10 days. 150 mL 0   benztropine (COGENTIN) 0.5 MG tablet Take 1 tablet (0.5 mg total) by mouth 2 (two) times daily. For prevention of drug induced tremors (Patient not taking: Reported on 03/20/2022) 60 tablet 0   omega-3 acid ethyl esters (LOVAZA) 1 g capsule Take 1 capsule (1 g total) by mouth 2 (two) times daily. For high cholesterol (Patient not taking: Reported on 03/20/2022) 30 capsule 0   Prenatal Vit-Fe Fumarate-FA (PRENATAL MULTIVITAMIN) TABS tablet Take 1 tablet by mouth daily at 12 noon. Vitamin supplement (Patient not taking: Reported on 03/20/2022) 1 tablet 0   risperiDONE (RISPERDAL) 3 MG tablet Take 1 tablet (3 mg total) by mouth 2 (two) times daily. For mood control (Patient not taking: Reported on  03/20/2022) 30 tablet 0   temazepam (RESTORIL) 30 MG capsule Take 1 capsule (30 mg total) by mouth at bedtime. For sleep (Patient not taking: Reported on 03/20/2022) 30 capsule 0   Psychiatric Specialty Exam: Presentation  General Appearance:  Appropriate for Environment  Eye Contact: Fair  Speech: Clear and Coherent  Speech Volume: Normal  Handedness:No data recorded  Mood and Affect  Mood: Anxious; Irritable  Affect: Congruent   Thought Process  Thought Processes: Coherent  Descriptions of Associations:Circumstantial  Orientation:Full (Time, Place and Person)  Thought Content:Paranoid Ideation  History of Schizophrenia/Schizoaffective disorder:No  Duration of Psychotic Symptoms:Less than six months  Hallucinations:Hallucinations: None  Ideas of Reference:Paranoia  Suicidal Thoughts:Suicidal Thoughts: No  Homicidal Thoughts:Homicidal Thoughts: No   Sensorium  Memory: Immediate Fair; Recent Fair  Judgment: Poor  Insight: Poor   Executive Functions  Concentration: Fair  Attention Span: Fair  Recall: Fair  Fund of  Knowledge: Fair  Language: Fair   Psychomotor Activity  Psychomotor Activity: Psychomotor Activity: Normal   Assets  Assets: Physical Health; Resilience; Social Support; Housing    Sleep  Sleep: Sleep: Fair   Physical Exam: Physical Exam Neurological:     Mental Status: He is alert and oriented to person, place, and time.  Psychiatric:        Attention and Perception: Attention normal.        Mood and Affect: Mood is anxious.        Speech: Speech normal.        Behavior: Behavior is cooperative.        Thought Content: Thought content is paranoid.        Judgment: Judgment is impulsive.    Review of Systems  Psychiatric/Behavioral:  Positive for substance abuse. The patient is nervous/anxious.        Aggression at home  All other systems reviewed and are negative.  Blood pressure 133/78, pulse 90, temperature 98.4 F (36.9 C), temperature source Oral, resp. rate 16, height  (1.803 m), SpO2 99 %. Body mass index is 29.32 kg/m.  Medical Decision Making: Pt case reviewed and discussed with Dr. Lucianne Muss. Pt does meet criteria for IVC and inpatient psychiatric treatment. There is no current availability at [facility], CSW notified to fax out patient.   Problem 1: psychosis/impulsivity/aggression - Risperidone 1 mg Qhs  Disposition:  Recommend inpatient psychiatric treatment  Eligha Bridegroom, NP 07/24/2022 2:38 PM

## 2022-07-24 NOTE — Progress Notes (Addendum)
Inpatient Behavioral Health Placement  Pt meets inpatient criteria per Eligha Bridegroom, NP . There are no available beds at [facility].  Referral was sent to the following facilities;  Destination  Service Provider Address Phone Fax  [facility]  [address], [place] [address] [phone]  [facility]  [address] [place], [place] [address] [phone]  [facility]  [address] [place] [address] [phone]  [facility]  [address] [place] [address] [phone]  [facility]  [address] [place] [address] [phone]  [facility]  [address] [place] [address] [phone]  [facility]  [address] [place] [address] [phone]  [facility]  [address] [place] [address] [phone]  [facility]  [address] [place] [address] [phone]  [facility]  [address] [place] [address] [phone]  [facility]  [address] [place] [address] [phone]  [facility]  [address] [place], [place] [address] [phone]  [facility]  [address], [place] [address] [phone]  [facility]  [address] [place], [place] [address] [phone]     Situation ongoing,  CSW will follow up.   Maryjean Ka, MSW, [facility] 07/24/2022  @ 11:37 PM

## 2022-07-25 ENCOUNTER — Other Ambulatory Visit: Payer: Self-pay | Admitting: Family Medicine

## 2022-07-25 LAB — RESP PANEL BY RT-PCR (FLU A&B, COVID) ARPGX2
Influenza A by PCR: NEGATIVE
Influenza B by PCR: NEGATIVE
SARS Coronavirus 2 by RT PCR: NEGATIVE

## 2022-07-25 NOTE — ED Notes (Signed)
Patient denies pain and is resting comfortably.  
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
## 2022-07-25 NOTE — ED Notes (Signed)
Attempted to call [facility] ,no answer.

## 2022-07-25 NOTE — ED Notes (Signed)
PT needs a COVID test before transport  to [facility]

## 2022-07-25 NOTE — ED Notes (Signed)
MOTHER CALLED AND SHE HAS pT'S PERSONAL ITEMS . PT DOES NOT HAVE ANY PERSONAL ITEMS TO TAKE WITH HIM

## 2022-07-25 NOTE — ED Notes (Signed)
2nd attempted to call [facility] to give report ,placed on hold

## 2022-07-25 NOTE — ED Notes (Signed)
Mother returned call and was given the name and address to [facility]

## 2022-07-25 NOTE — ED Notes (Signed)
Sheriff called for transport  

## 2022-07-25 NOTE — ED Notes (Signed)
Attempted to call report to [facility],call disconnected .

## 2022-07-25 NOTE — ED Notes (Signed)
TC to Pt's Mother to tell her about Pt transfered to [facility]

## 2022-08-16 ENCOUNTER — Ambulatory Visit
Admission: RE | Admit: 2022-08-16 | Discharge: 2022-08-16 | Disposition: A | Discharge status: Home / Self Care | Payer: Medicaid Other | Source: Ambulatory Visit | Attending: Internal Medicine | Admitting: Internal Medicine

## 2022-08-16 ENCOUNTER — Other Ambulatory Visit: Payer: Self-pay | Admitting: Internal Medicine

## 2022-08-16 DIAGNOSIS — M542 Cervicalgia: Secondary | ICD-10-CM
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
## 2022-09-06 ENCOUNTER — Encounter: Payer: Self-pay | Admitting: Physical Therapy

## 2022-09-06 ENCOUNTER — Ambulatory Visit: Payer: Medicaid Other | Attending: Internal Medicine | Admitting: Physical Therapy

## 2022-09-06 DIAGNOSIS — M6281 Muscle weakness (generalized): Secondary | ICD-10-CM | POA: Diagnosis present

## 2022-09-06 DIAGNOSIS — M542 Cervicalgia: Secondary | ICD-10-CM | POA: Insufficient documentation

## 2022-09-06 DIAGNOSIS — R293 Abnormal posture: Secondary | ICD-10-CM | POA: Diagnosis present
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
## 2022-09-13 ENCOUNTER — Encounter: Payer: Self-pay | Admitting: Physical Therapy

## 2022-09-13 ENCOUNTER — Ambulatory Visit: Payer: Medicaid Other | Admitting: Physical Therapy

## 2022-09-13 DIAGNOSIS — M542 Cervicalgia: Secondary | ICD-10-CM

## 2022-09-13 DIAGNOSIS — R293 Abnormal posture: Secondary | ICD-10-CM

## 2022-09-13 DIAGNOSIS — M6281 Muscle weakness (generalized): Secondary | ICD-10-CM
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
## 2022-09-19 ENCOUNTER — Ambulatory Visit: Payer: Medicaid Other | Admitting: Physical Therapy

## 2022-09-19 ENCOUNTER — Encounter: Payer: Self-pay | Admitting: Physical Therapy

## 2022-09-19 DIAGNOSIS — R293 Abnormal posture: Secondary | ICD-10-CM

## 2022-09-19 DIAGNOSIS — M6281 Muscle weakness (generalized): Secondary | ICD-10-CM

## 2022-09-19 DIAGNOSIS — M542 Cervicalgia: Secondary | ICD-10-CM
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
## 2022-09-21 ENCOUNTER — Ambulatory Visit: Payer: Medicaid Other | Attending: Internal Medicine | Admitting: Physical Therapy

## 2022-09-21 ENCOUNTER — Encounter: Payer: Self-pay | Admitting: Physical Therapy

## 2022-09-21 DIAGNOSIS — M6281 Muscle weakness (generalized): Secondary | ICD-10-CM | POA: Diagnosis present

## 2022-09-21 DIAGNOSIS — M542 Cervicalgia: Secondary | ICD-10-CM | POA: Diagnosis present

## 2022-09-21 DIAGNOSIS — R293 Abnormal posture: Secondary | ICD-10-CM | POA: Diagnosis present
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
## 2022-09-26 ENCOUNTER — Encounter: Payer: Self-pay | Admitting: Physical Therapy

## 2022-09-26 ENCOUNTER — Ambulatory Visit: Payer: Medicaid Other | Admitting: Physical Therapy

## 2022-09-26 DIAGNOSIS — M542 Cervicalgia: Secondary | ICD-10-CM | POA: Diagnosis not present

## 2022-09-26 DIAGNOSIS — M6281 Muscle weakness (generalized): Secondary | ICD-10-CM

## 2022-09-26 DIAGNOSIS — R293 Abnormal posture: Secondary | ICD-10-CM
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
## 2022-09-28 ENCOUNTER — Encounter: Payer: Self-pay | Admitting: Physical Therapy

## 2022-09-28 ENCOUNTER — Ambulatory Visit: Payer: Medicaid Other | Admitting: Physical Therapy

## 2022-09-28 DIAGNOSIS — M542 Cervicalgia: Secondary | ICD-10-CM

## 2022-09-28 DIAGNOSIS — R293 Abnormal posture: Secondary | ICD-10-CM

## 2022-09-28 DIAGNOSIS — M6281 Muscle weakness (generalized): Secondary | ICD-10-CM

## 2022-10-03 ENCOUNTER — Ambulatory Visit: Payer: Medicaid Other | Admitting: Physical Therapy

## 2022-10-05 ENCOUNTER — Ambulatory Visit: Payer: Medicaid Other | Admitting: Physical Therapy

## 2022-10-05 ENCOUNTER — Encounter: Payer: Self-pay | Admitting: Physical Therapy

## 2022-10-05 DIAGNOSIS — M6281 Muscle weakness (generalized): Secondary | ICD-10-CM

## 2022-10-05 DIAGNOSIS — R293 Abnormal posture: Secondary | ICD-10-CM

## 2022-10-05 DIAGNOSIS — M542 Cervicalgia: Secondary | ICD-10-CM
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
## 2022-10-10 ENCOUNTER — Ambulatory Visit: Payer: Medicaid Other | Admitting: Physical Therapy

## 2022-10-10 ENCOUNTER — Telehealth: Payer: Self-pay | Admitting: Physical Therapy

## 2022-10-10 NOTE — Telephone Encounter (Signed)
Attempted to call pt with assistance of [employer] interpreter services re: this morning's missed appt - notification that phone number not receiving calls at this time, unable to leave voicemail
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
## 2022-10-12 ENCOUNTER — Ambulatory Visit: Payer: Medicaid Other | Admitting: Physical Therapy

## 2022-10-12 ENCOUNTER — Encounter: Payer: Self-pay | Admitting: Physical Therapy

## 2022-10-12 DIAGNOSIS — M542 Cervicalgia: Secondary | ICD-10-CM | POA: Diagnosis not present

## 2022-10-12 DIAGNOSIS — M6281 Muscle weakness (generalized): Secondary | ICD-10-CM

## 2022-10-12 DIAGNOSIS — R293 Abnormal posture: Secondary | ICD-10-CM

## 2022-10-17 ENCOUNTER — Ambulatory Visit: Payer: Medicaid Other | Admitting: Physical Therapy

## 2022-10-18 ENCOUNTER — Encounter (HOSPITAL_COMMUNITY): Payer: Self-pay | Admitting: Emergency Medicine

## 2022-10-18 ENCOUNTER — Ambulatory Visit (HOSPITAL_COMMUNITY)
Admission: EM | Admit: 2022-10-18 | Discharge: 2022-10-18 | Disposition: A | Discharge status: Home / Self Care | Payer: Medicaid Other | Attending: Family Medicine | Admitting: Family Medicine

## 2022-10-18 ENCOUNTER — Other Ambulatory Visit: Payer: Self-pay

## 2022-10-18 DIAGNOSIS — R519 Headache, unspecified: Secondary | ICD-10-CM | POA: Insufficient documentation

## 2022-10-18 LAB — CBC WITH DIFFERENTIAL/PLATELET
Abs Immature Granulocytes: 0.03 10*3/uL (ref 0.00–0.07)
Basophils Absolute: 0.1 10*3/uL (ref 0.0–0.1)
Basophils Relative: 0 %
Eosinophils Absolute: 0.1 10*3/uL (ref 0.0–0.5)
Eosinophils Relative: 1 %
HCT: 45.5 % (ref 39.0–52.0)
Hemoglobin: 14.7 g/dL (ref 13.0–17.0)
Immature Granulocytes: 0 %
Lymphocytes Relative: 24 %
Lymphs Abs: 2.8 10*3/uL (ref 0.7–4.0)
MCH: 28 pg (ref 26.0–34.0)
MCHC: 32.3 g/dL (ref 30.0–36.0)
MCV: 86.7 fL (ref 80.0–100.0)
Monocytes Absolute: 0.7 10*3/uL (ref 0.1–1.0)
Monocytes Relative: 6 %
Neutro Abs: 7.7 10*3/uL (ref 1.7–7.7)
Neutrophils Relative %: 69 %
Platelets: 356 10*3/uL (ref 150–400)
RBC: 5.25 MIL/uL (ref 4.22–5.81)
RDW: 13 % (ref 11.5–15.5)
WBC: 11.4 10*3/uL — ABNORMAL HIGH (ref 4.0–10.5)
nRBC: 0 % (ref 0.0–0.2)

## 2022-10-18 LAB — COMPREHENSIVE METABOLIC PANEL
ALT: 14 U/L (ref 0–44)
AST: 17 U/L (ref 15–41)
Albumin: 4.1 g/dL (ref 3.5–5.0)
Alkaline Phosphatase: 96 U/L (ref 38–126)
Anion gap: 10 (ref 5–15)
BUN: 7 mg/dL (ref 6–20)
CO2: 26 mmol/L (ref 22–32)
Calcium: 9.7 mg/dL (ref 8.9–10.3)
Chloride: 102 mmol/L (ref 98–111)
Creatinine, Ser: 0.64 mg/dL (ref 0.61–1.24)
GFR, Estimated: >60 mL/min (ref >60)
Glucose, Bld: 101 mg/dL — ABNORMAL HIGH (ref 70–99)
Potassium: 3.7 mmol/L (ref 3.5–5.1)
Sodium: 138 mmol/L (ref 135–145)
Total Bilirubin: 0.5 mg/dL (ref 0.3–1.2)
Total Protein: 7.9 g/dL (ref 6.5–8.1)

## 2022-10-18 NOTE — Discharge Instructions (Signed)
We have drawn blood work to check your blood counts and for anemia.  We are also going to check your sodium and potassium and sugar and kidney function.  Staff will notify you of anything is significantly abnormal and needs treatment.  Please follow-up with your primary care about this issue  Also, please follow-up with your psychiatric provider about your medication and the symptoms.
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
## 2022-10-18 NOTE — ED Provider Notes (Signed)
Jon Branch    CSN: [id number] Arrival date & time: 10/18/22  1649      History   Chief Complaint Chief Complaint  Patient presents with   Headache    HPI Jon Branch is a 22 y.o. male.    Headache  Here for h/a that's begun bothering him sometime in the last 3 months. The headache is described as a pinching in his right or left vertex of the head. It lasts 2 seconds. It can happen 2-3 times a day, or may not occur in 24 hours.  No associated nausea or vomiting or diarrhea.  No sweats no weight loss.  No fever or upper respiratory congestion.  No rash  He thinks the first time he had it was about 3 months ago. He began taking benztropine and Risperdal about 2 months ago.  He feels that a lot of times when he takes these medicines is when the pain occurs.  He states the pain makes him feel like he cannot concentrate.  Then he states that these medications make him feel like he cannot concentrate.  Was seen recently by his primary care, but did not discuss this pain with Dr. Maia Petties.  No lab was done at that time.  No fainting or syncope.   Past Medical History:  Diagnosis Date   GERD (gastroesophageal reflux disease)    Hypertension     Patient Active Problem List   Diagnosis Date Noted   Cannabis abuse 07/24/2022   Substance induced mood disorder ([place]) 07/24/2022   Chest tightness 04/17/2022   Shortness of breath 04/17/2022   MDD (major depressive disorder), recurrent, severe, with psychosis ([place]) 10/14/2018   Schizophreniform disorder ([place])     Past Surgical History:  Procedure Laterality Date   EYE SURGERY         Home Medications    Prior to Admission medications   Medication Sig Start Date End Date Taking? Authorizing Provider  benztropine (COGENTIN) 0.5 MG tablet Take 1 tablet (0.5 mg total) by mouth 2 (two) times daily. For prevention of drug induced tremors 10/16/18   Lindell Spar I, NP  omega-3 acid ethyl esters (LOVAZA) 1 g capsule Take 1  capsule (1 g total) by mouth 2 (two) times daily. For high cholesterol Patient not taking: Reported on 03/20/2022 10/16/18   Lindell Spar I, NP  Prenatal Vit-Fe Fumarate-FA (PRENATAL MULTIVITAMIN) TABS tablet Take 1 tablet by mouth daily at 12 noon. Vitamin supplement Patient not taking: Reported on 03/20/2022 10/16/18   Lindell Spar I, NP  risperiDONE (RISPERDAL) 3 MG tablet Take 1 tablet (3 mg total) by mouth 2 (two) times daily. For mood control 10/16/18   Lindell Spar I, NP  temazepam (RESTORIL) 30 MG capsule Take 1 capsule (30 mg total) by mouth at bedtime. For sleep Patient not taking: Reported on 03/20/2022 10/16/18   Lindell Spar I, NP    Family History Family History  Problem Relation Age of Onset   High blood pressure Mother    Diabetes Mother    High blood pressure Father    High blood pressure Maternal Grandmother    Diabetes Maternal Grandmother    High blood pressure Paternal Grandmother    Colon cancer Neg Hx     Social History Social History   Tobacco Use   Smoking status: Former    Types: Cigarettes   Smokeless tobacco: Never  Vaping Use   Vaping Use: Former   Substances: Flavoring  Substance Use Topics   Alcohol  use: Never   Drug use: Not Currently    Types: Marijuana     Allergies   Losartan and Pork-derived products   Review of Systems Review of Systems  Neurological:  Positive for headaches.     Physical Exam Triage Vital Signs ED Triage Vitals  Enc Vitals Group     BP 10/18/22 1848 133/88     Pulse Rate 10/18/22 1848 97     Resp 10/18/22 1848 18     Temp 10/18/22 1848 98.1 F (36.7 C)     Temp Source 10/18/22 1848 Oral     SpO2 10/18/22 1848 96 %     Weight --      Height --      Head Circumference --      Peak Flow --      Pain Score 10/18/22 1842 10     Pain Loc --      Pain Edu? --      Excl. in [place]? --    No data found.  Updated Vital Signs BP 133/88 (BP Location: Left Arm) Comment (BP Location): large cuff  Pulse 97   Temp 98.1  F (36.7 C) (Oral)   Resp 18   SpO2 96%   Visual Acuity Right Eye Distance:   Left Eye Distance:   Bilateral Distance:    Right Eye Near:   Left Eye Near:    Bilateral Near:     Physical Exam Vitals reviewed.  Constitutional:      General: He is not in acute distress.    Appearance: He is not toxic-appearing.  HENT:     Nose: Nose normal.     Mouth/Throat:     Mouth: Mucous membranes are moist.     Pharynx: No oropharyngeal exudate or posterior oropharyngeal erythema.  Eyes:     Extraocular Movements: Extraocular movements intact.     Conjunctiva/sclera: Conjunctivae normal.     Pupils: Pupils are equal, round, and reactive to light.  Cardiovascular:     Rate and Rhythm: Normal rate and regular rhythm.     Heart sounds: No murmur heard. Pulmonary:     Effort: Pulmonary effort is normal.     Breath sounds: Normal breath sounds.  Musculoskeletal:     Cervical back: Neck supple.  Lymphadenopathy:     Cervical: No cervical adenopathy.  Skin:    Capillary Refill: Capillary refill takes less than 2 seconds.     Coloration: Skin is not jaundiced or pale.  Neurological:     General: No focal deficit present.     Mental Status: He is alert and oriented to person, place, and time.     Cranial Nerves: No cranial nerve deficit.     Sensory: No sensory deficit.     Motor: No weakness.     Coordination: Coordination normal.     Gait: Gait normal.     Deep Tendon Reflexes: Reflexes normal.  Psychiatric:        Behavior: Behavior normal.      UC Treatments / Results  Labs (all labs ordered are listed, but only abnormal results are displayed) Labs Reviewed  CBC WITH DIFFERENTIAL/PLATELET  COMPREHENSIVE METABOLIC PANEL    EKG   Radiology No results found.  Procedures Procedures (including critical care time)  Medications Ordered in UC Medications - No data to display  Initial Impression / Assessment and Plan / UC Course  I have reviewed the triage vital  signs and the nursing notes.  Pertinent labs &  imaging results that were available during my care of the patient were reviewed by me and considered in my medical decision making (see chart for details).        The entirety of the visit is accomplished with the help of video interpretation in Arabic.  I discussed with the patient and with his mother in the room, that I do not feel that this is anything typical of a brain tumor or other intracranial pathology.  We are going to do some basic labs today to get evaluation started.  I have discussed with him that I do not know that his medications are causing this problem; I have asked him not to stop these medications until he gets to talk with the prescribing provider of those medications.  I have asked him to follow-up with his general doctor concerning this issue.  Then mother asked through the interpreter if we can do some imaging.  I discussed with her that in the urgent care setting we have difficulty accomplishing advanced imaging like CAT scans.  I reiterated that I do not feel this is typical for any serious intracranial pathology.  However if he worsens in any way with more persistent pain or vomiting without nausea, etc. then they should proceed to the emergency room for further evaluation. Final Clinical Impressions(s) / UC Diagnoses   Final diagnoses:  Nonintractable episodic headache, unspecified headache type     Discharge Instructions      We have drawn blood work to check your blood counts and for anemia.  We are also going to check your sodium and potassium and sugar and kidney function.  Staff will notify you of anything is significantly abnormal and needs treatment.  Please follow-up with your primary care about this issue  Also, please follow-up with your psychiatric provider about your medication and the symptoms.     ED Prescriptions   None    PDMP not reviewed this encounter.   Barrett Henle,  MD 10/18/22 [phone]

## 2022-10-18 NOTE — ED Triage Notes (Signed)
Complains of headache, described as palpitation.  Pain makes it difficult to concentrate.  Pain occurs 2 times a day.  Pain does not occur every day.    Describes breathing as not comfortable, overwhelmed  Patient thinks symptoms may be related to benzotropine and risperidone.  Both prescribed by [employer], np

## 2022-10-19 ENCOUNTER — Encounter: Payer: Self-pay | Admitting: Physical Therapy

## 2022-10-19 ENCOUNTER — Ambulatory Visit: Payer: Medicaid Other | Attending: Internal Medicine | Admitting: Physical Therapy

## 2022-10-19 DIAGNOSIS — R293 Abnormal posture: Secondary | ICD-10-CM | POA: Diagnosis present

## 2022-10-19 DIAGNOSIS — M6281 Muscle weakness (generalized): Secondary | ICD-10-CM | POA: Diagnosis present

## 2022-10-19 DIAGNOSIS — M542 Cervicalgia: Secondary | ICD-10-CM | POA: Insufficient documentation

## 2022-10-25 ENCOUNTER — Encounter: Payer: Self-pay | Admitting: Physical Therapy

## 2022-10-25 ENCOUNTER — Ambulatory Visit: Payer: Medicaid Other | Admitting: Physical Therapy

## 2022-10-25 DIAGNOSIS — M6281 Muscle weakness (generalized): Secondary | ICD-10-CM

## 2022-10-25 DIAGNOSIS — M542 Cervicalgia: Secondary | ICD-10-CM | POA: Diagnosis not present

## 2022-10-25 DIAGNOSIS — R293 Abnormal posture: Secondary | ICD-10-CM
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
## 2022-11-01 ENCOUNTER — Encounter: Payer: Self-pay | Admitting: Physical Therapy

## 2022-11-01 ENCOUNTER — Ambulatory Visit: Payer: Medicaid Other | Admitting: Physical Therapy

## 2022-11-01 DIAGNOSIS — M542 Cervicalgia: Secondary | ICD-10-CM | POA: Diagnosis not present

## 2022-11-01 DIAGNOSIS — R293 Abnormal posture: Secondary | ICD-10-CM

## 2022-11-01 DIAGNOSIS — M6281 Muscle weakness (generalized): Secondary | ICD-10-CM
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
## 2023-05-30 ENCOUNTER — Ambulatory Visit: Payer: MEDICAID | Admitting: Adult Health

## 2023-05-30 ENCOUNTER — Ambulatory Visit (INDEPENDENT_AMBULATORY_CARE_PROVIDER_SITE_OTHER): Payer: MEDICAID

## 2023-05-30 ENCOUNTER — Encounter: Payer: Self-pay | Admitting: Adult Health

## 2023-05-30 VITALS — BP 116/80 | HR 84 | Ht 71.0 in | Wt 234.0 lb

## 2023-05-30 DIAGNOSIS — R0602 Shortness of breath: Secondary | ICD-10-CM

## 2023-05-30 LAB — BASIC METABOLIC PANEL
BUN: 12 mg/dL (ref 6–23)
CO2: 26 meq/L (ref 19–32)
Calcium: 9.7 mg/dL (ref 8.4–10.5)
Chloride: 104 meq/L (ref 96–112)
Creatinine, Ser: 0.62 mg/dL (ref 0.40–1.50)
GFR: 134.34 mL/min (ref >60.00)
Glucose, Bld: 114 mg/dL — ABNORMAL HIGH (ref 70–99)
Potassium: 3.9 meq/L (ref 3.5–5.1)
Sodium: 137 meq/L (ref 135–145)

## 2023-05-30 LAB — CBC WITH DIFFERENTIAL/PLATELET
Basophils Absolute: 0.1 10*3/uL (ref 0.0–0.1)
Basophils Relative: 0.6 % (ref 0.0–3.0)
Eosinophils Absolute: 0.1 10*3/uL (ref 0.0–0.7)
Eosinophils Relative: 1.1 % (ref 0.0–5.0)
HCT: 42.3 % (ref 39.0–52.0)
Hemoglobin: 13.3 g/dL (ref 13.0–17.0)
Lymphocytes Relative: 22.5 % (ref 12.0–46.0)
Lymphs Abs: 2.7 10*3/uL (ref 0.7–4.0)
MCHC: 31.5 g/dL (ref 30.0–36.0)
MCV: 87.1 fL (ref 78.0–100.0)
Monocytes Absolute: 1 10*3/uL (ref 0.1–1.0)
Monocytes Relative: 8.2 % (ref 3.0–12.0)
Neutro Abs: 8.2 10*3/uL — ABNORMAL HIGH (ref 1.4–7.7)
Neutrophils Relative %: 67.6 % (ref 43.0–77.0)
Platelets: 372 10*3/uL (ref 150.0–400.0)
RBC: 4.86 Mil/uL (ref 4.22–5.81)
RDW: 14.1 % (ref 11.5–15.5)
WBC: 12.1 10*3/uL — ABNORMAL HIGH (ref 4.0–10.5)

## 2023-05-30 LAB — BRAIN NATRIURETIC PEPTIDE: Pro B Natriuretic peptide (BNP): 12 pg/mL (ref 0.0–100.0)

## 2023-05-30 NOTE — Assessment & Plan Note (Addendum)
Chronic shortness of breath and chest tightness going on for greater than 1.5 years questionable etiology.  We discussed symptoms could be related to underlying asthma.  Would like to set patient up for pulmonary function testing.  Will check chest x-ray today.  Will set patient up for 2D echo to rule out underlying cardiac source.  Lab work including CBC be met and BNP are pending.  Previous CT chest was clear.  D-dimer was negative in the past.  He has no symptoms consistent with VTE.  Denies hemoptysis, chest pain, calf pain or swelling.  O2 saturations are normal on room air. Will complete the above test and review everything on his return visit in 4 weeks. Today's visit was accompanied by interpreter, instructions were placed in English and in Arabic.  Patient verbalized understanding.

## 2023-05-30 NOTE — Progress Notes (Signed)
@Patient  ID: Jon Branch, male    DOB: [birth year], 22 y.o.   MRN: [id number]  Chief Complaint  Patient presents with   Follow-up    Referring provider: Har[place], MD  HPI: 22 year old male former smoker seen for pulmonary consult April 17, 2022 for shortness of breath and chest tightness Speaks Arabic -Needs Interpretor  Medical history significant for Schizophreniform, Depression  TEST/EVENTS :  CT chest April 02, 2022 clear lungs.  No acute abnormality noted. April 04, 2022 D-dimer negative  05/30/2023 Follow up : Dyspnea and Chest tightness  Patient presents for a follow-up visit.  Last seen August 2023.  Patient was seen at last visit for a pulmonary consult for shortness of breath and chest tightness.  Patient had complained of 5 months of shortness of breath and difficulty breathing when taking in a deep breath.  Patient had reported that a cabinet fell on his chest wall.  He also tells me today that he had a very deep back massage that he feels may have contributed to his symptoms.  Patient over the last year and a half that he has episodes of shortness of breath, tightness in his chest.  Has had a couple episodes of bleeding.  He has no associated cough.  Is also had some GI complaints with intermittent dysphagia and burping.  Has been seen by gastroenterology.  Did have an esophageal dilation but says that he did not have any improvement in symptoms.  He does have a reported history of previous smoking and vaping.  However today says he has never done that.  He says he has no pets.  No basement no hot tub.  No exposure to birds or chickens.  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  Patient says he was given an albuterol inhaler in the past but says that he did not try this.  He also says he does not want to take any inhalers.  He has no known history of asthma or COPD.  Patient is concerned that his symptoms could be related to his heart.  He has no  known history of heart issues.  He denies any chest pain, orthopnea ankle edema, joint swelling, rash or calf pain.  As above CT chest August 2023 showed clear lungs.  Lab work during that workup showed a negative D-dimer.  Patient was recommended for pulmonary function testing last visit but unfortunately did not return for this. For today's visit he is accompanied by an interpreter as he does not speak [place].  Speaks and reads Arabic. He does have a history of depression, schizophreniform, and psychosis.  Currently taking Cogentin and Risperdal.    Allergies  Allergen Reactions   Losartan Swelling   Pork-Derived Products      There is no immunization history on file for this patient.  Past Medical History:  Diagnosis Date   GERD (gastroesophageal reflux disease)    Hypertension     Tobacco History: Social History   Tobacco Use  Smoking Status Former   Types: Cigarettes  Smokeless Tobacco Never   Counseling given: Not Answered   Outpatient Medications Prior to Visit  Medication Sig Dispense Refill   benztropine (COGENTIN) 0.5 MG tablet Take 1 tablet (0.5 mg total) by mouth 2 (two) times daily. For prevention of drug induced tremors 60 tablet 0   Prenatal Vit-Fe Fumarate-FA (PRENATAL MULTIVITAMIN) TABS tablet Take 1 tablet by mouth daily at 12 noon. Vitamin supplement 1 tablet 0   risperiDONE (  RISPERDAL) 3 MG tablet Take 1 tablet (3 mg total) by mouth 2 (two) times daily. For mood control (Patient taking differently: Take 3 mg by mouth daily. 1/2 tablet For mood control) 30 tablet 0   No facility-administered medications prior to visit.     Review of Systems:   Constitutional:   No  weight loss, night sweats,  Fevers, chills, fatigue, or  lassitude.  HEENT:   No headaches,  Difficulty swallowing,  Tooth/dental problems, or  Sore throat,                No sneezing, itching, ear ache, nasal congestion, post nasal drip,   CV:  No chest pain,  Orthopnea, PND, swelling  in lower extremities, anasarca, dizziness, palpitations, syncope.   GI  No heartburn, indigestion, abdominal pain, nausea, vomiting, diarrhea, change in bowel habits, loss of appetite, bloody stools.   Resp: .  No chest wall deformity  Skin: no rash or lesions.  GU: no dysuria, change in color of urine, no urgency or frequency.  No flank pain, no hematuria   MS:  No joint pain or swelling.  No decreased range of motion.  No back pain.    Physical Exam  BP 116/80 (BP Location: Left Arm, Cuff Size: Large)   Pulse 84   Ht 5\' 11"  (1.803 m)   Wt 234 lb (106.1 kg)   SpO2 94%   BMI 32.64 kg/m   GEN: A/Ox3; pleasant , NAD, well nourished    HEENT:  [place]/AT,   NOSE-clear, THROAT-clear, no lesions, no postnasal drip or exudate noted.   NECK:  Supple w/ fair ROM; no JVD; normal carotid impulses w/o bruits; no thyromegaly or nodules palpated; no lymphadenopathy.    RESP  Clear  P & A; w/o, wheezes/ rales/ or rhonchi. no accessory muscle use, no dullness to percussion  CARD:  RRR, no m/r/g, no peripheral edema, pulses intact, no cyanosis or clubbing.  GI:   Soft & nt; nml bowel sounds; no organomegaly or masses detected.   Musco: Warm bil, no deformities or joint swelling noted.   Neuro: alert, no focal deficits noted.    Skin: Warm, no lesions or rashes    Lab Results:  CBC    BNP No results found for: "BNP"  ProBNP No results found for: "PROBNP"  Imaging: No results found.  Administration History     None           No data to display          No results found for: "NITRICOXIDE"      Assessment & Plan:   Shortness of breath Chronic shortness of breath and chest tightness going on for greater than 1.5 years questionable etiology.  We discussed symptoms could be related to underlying asthma.  Would like to set patient up for pulmonary function testing.  Will check chest x-ray today.  Will set patient up for 2D echo to rule out underlying cardiac  source.  Lab work including CBC be met and BNP are pending.  Previous CT chest was clear.  D-dimer was negative in the past.  He has no symptoms consistent with VTE.  Denies hemoptysis, chest pain, calf pain or swelling.  O2 saturations are normal on room air. Will complete the above test and review everything on his return visit in 4 weeks. Today's visit was accompanied by interpreter, instructions were placed in English and in Arabic.  Patient verbalized understanding.     I spent  53  minutes dedicated to the care of this patient on the date of this encounter to include pre-visit review of records, face-to-face time with the patient discussing conditions above, post visit ordering of testing, clinical documentation with the electronic health record, making appropriate referrals as documented, and communicating necessary findings to members of the patients care team.   Rubye Oaks, NP 05/30/2023

## 2023-05-30 NOTE — Patient Instructions (Addendum)
Chest xray today.  Labs today  Set up for PFTs  2 D echo  Follow up with Dr. Everardo All at [facility] office in 4-6 weeks and As needed   Please contact office for sooner follow up if symptoms do not improve or worsen or seek emergency care   ???? ????? ?????.  ??????? ?????  ????? ?PFTs  ??? 2D  ???? ?? ??????? ?????? ?? ???? [facility] ???? 4-6 ?????? ???? ??????   ???? ??????? ??????? ???????? ?????? ??? ?? ????? ??????? ?? ????? ????? ?? ???? ????? ???????

## 2023-06-05 ENCOUNTER — Other Ambulatory Visit: Payer: Self-pay | Admitting: *Deleted

## 2023-06-05 DIAGNOSIS — R0600 Dyspnea, unspecified: Secondary | ICD-10-CM

## 2023-06-06 NOTE — Progress Notes (Signed)
Called and spoke with patient (speaks [place]), provided results/recommendations per Rubye Oaks NP.  He verbalized understanding.  Nothing further needed.

## 2023-06-25 ENCOUNTER — Ambulatory Visit (INDEPENDENT_AMBULATORY_CARE_PROVIDER_SITE_OTHER): Payer: MEDICAID

## 2023-06-25 DIAGNOSIS — R0600 Dyspnea, unspecified: Secondary | ICD-10-CM

## 2023-06-25 LAB — ECHOCARDIOGRAM COMPLETE
Area-P 1/2: 4.63 cm2
S' Lateral: 3.36 cm

## 2023-07-04 ENCOUNTER — Telehealth (HOSPITAL_BASED_OUTPATIENT_CLINIC_OR_DEPARTMENT_OTHER): Payer: Self-pay | Admitting: Adult Health

## 2023-07-04 NOTE — Telephone Encounter (Signed)
Patient stopped by the [facility] desk at [facility] to ask about the result of his echo. According to the patient he was told that he would get a call a couple of days after his appt on 11/5 but hasn't heard from anyone yet. Patient would prefer having an Arabic speaking medical interpreter on the call as well.  Patient was unsure about when his PFT and pulm office visit appointments were so I printed appointment reminders for him and showed him the pulm check in desk for those appointments.

## 2023-07-04 NOTE — Telephone Encounter (Signed)
I have notified the patient. He scheduled for PFTs on 12/11.   Nothing further needed.

## 2023-07-04 NOTE — Telephone Encounter (Signed)
Echo results were done last week -not called yet   2D echo showed normal pump function of the heart.  EF was 60 to 65%.  Right ventricular size was normal.  No valvular disease noted. Continue with office visit recommendations and follow-up  Please let him know labs results as well. Does not look like he received those as well.   Please order PFT   Make sure he has follow up visit -per AVS last ov

## 2023-07-15 NOTE — Progress Notes (Signed)
Called and spoke with patient, advised of results/recommendations per Rubye Oaks NP.  He verbalized understanding.  Nothing further needed.

## 2023-07-24 NOTE — Progress Notes (Signed)
Called and spoke with patient, advised of results/recommendations per Rubye Oaks NP.  He verbalized understanding.  He wanted to know what time he needed to be here for his upcoming PFT on 12/11.  I advised hi he needed to be here by 9:15 am for a 9:30 am PFT.  He verbalized understanding.  Nothing further needed.

## 2023-07-30 ENCOUNTER — Other Ambulatory Visit (HOSPITAL_BASED_OUTPATIENT_CLINIC_OR_DEPARTMENT_OTHER): Payer: Self-pay

## 2023-07-30 DIAGNOSIS — R0600 Dyspnea, unspecified: Secondary | ICD-10-CM

## 2023-07-31 ENCOUNTER — Encounter (HOSPITAL_BASED_OUTPATIENT_CLINIC_OR_DEPARTMENT_OTHER): Payer: Self-pay | Admitting: Pulmonary Disease

## 2023-07-31 ENCOUNTER — Ambulatory Visit (INDEPENDENT_AMBULATORY_CARE_PROVIDER_SITE_OTHER): Payer: MEDICAID | Admitting: Pulmonary Disease

## 2023-07-31 ENCOUNTER — Ambulatory Visit (HOSPITAL_BASED_OUTPATIENT_CLINIC_OR_DEPARTMENT_OTHER): Payer: MEDICAID | Admitting: Pulmonary Disease

## 2023-07-31 VITALS — BP 112/72 | HR 86 | Resp 16 | Ht 71.0 in | Wt 239.2 lb

## 2023-07-31 DIAGNOSIS — R0600 Dyspnea, unspecified: Secondary | ICD-10-CM

## 2023-07-31 DIAGNOSIS — R0602 Shortness of breath: Secondary | ICD-10-CM

## 2023-07-31 LAB — PULMONARY FUNCTION TEST
DL/VA % pred: 122 %
DL/VA: 6.23 ml/min/mmHg/L
DLCO cor % pred: 92 %
DLCO cor: 31.48 ml/min/mmHg
DLCO unc % pred: 92 %
DLCO unc: 31.48 ml/min/mmHg
FEF 25-75 Post: 4.58 L/s
FEF 25-75 Pre: 4.41 L/s
FEF2575-%Change-Post: 3 %
FEF2575-%Pred-Post: 94 %
FEF2575-%Pred-Pre: 90 %
FEV1-%Change-Post: 3 %
FEV1-%Pred-Post: 81 %
FEV1-%Pred-Pre: 78 %
FEV1-Post: 3.83 L
FEV1-Pre: 3.69 L
FEV1FVC-%Change-Post: 0 %
FEV1FVC-%Pred-Pre: 105 %
FEV6-%Change-Post: 3 %
FEV6-%Pred-Post: 76 %
FEV6-%Pred-Pre: 73 %
FEV6-Post: 4.37 L
FEV6-Pre: 4.21 L
FEV6FVC-%Pred-Post: 100 %
FEV6FVC-%Pred-Pre: 100 %
FVC-%Change-Post: 3 %
FVC-%Pred-Post: 76 %
FVC-%Pred-Pre: 73 %
FVC-Post: 4.37 L
FVC-Pre: 4.21 L
Post FEV1/FVC ratio: 88 %
Post FEV6/FVC ratio: 100 %
Pre FEV1/FVC ratio: 88 %
Pre FEV6/FVC Ratio: 100 %
RV % pred: 50 %
RV: 0.79 L
TLC % pred: 73 %
TLC: 5.18 L

## 2023-07-31 NOTE — Patient Instructions (Signed)
Full PFT Performed Today  

## 2023-07-31 NOTE — Progress Notes (Signed)
Full PFT Performed Today  

## 2023-07-31 NOTE — Patient Instructions (Signed)
  Chest tightness Shortness of breath Reviewed PFTs. Suspect chest wall restriction. Normal spirometry and DLCO. Advised patient to discuss further work-up with PCP  Chest pain, suspect musculoskeletal Over the counter tylenol or motrin for pain control as directed Offered physical therapy however patient declined

## 2023-07-31 NOTE — Progress Notes (Signed)
Subjective:   PATIENT ID: Jon Branch GENDER: male DOB: 2001-03-26, MRN: [id number]   HPI  Chief Complaint  Patient presents with   Consult    PFT FU    Reason for Visit: PFT follow-up  Jon Branch Jon Branch is a 22 year old male former smoker with schizophreniform d/o, depression who presents for PFT follow-up. Arabic interpretor present  Initial consult 04/17/22 He reports previously normal health until several months ago. He reports difficulty breathing when taking a deep breath for the last 4-5 months. Around this time he had a cabinet fall on his chest. Was evaluated in the ED for persistent chest pain,notes reviewed with no evidence of acute/chronic injury on CT. No associated illness. In the beginning he had a nonproductive cough in the mornings but this has resolved. No wheezing. He reports he chest seems to have a lot of pressure. Does have pain with palpation. Denies personal or family history of respiratory issues. No asthma. Has reports issues swallowing sometimes that has not resolved in this same time period.  07/31/23 He was last seen by NP [place] 05/30/23 for dyspnea and chest tightness. Was seen by GI for dysphagia and belching and had esophageal dilation that did not improve his symptoms. Has not tried any inhalers. He has related his shortness of breath and chest pain after receiving a massage from his friend.   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, Systems developer, Press photographer, Arts administrator exposures: None  Past Medical History:  Diagnosis Date   GERD (gastroesophageal reflux disease)    Hypertension      Family History  Problem Relation Age of Onset   High blood pressure Mother    Diabetes Mother    High blood pressure Father    High blood pressure Maternal Grandmother    Diabetes Maternal Grandmother    High  blood pressure Paternal Grandmother    Colon cancer Neg Hx      Social History   Occupational History   Not on file  Tobacco Use   Smoking status: Former    Types: Cigarettes   Smokeless tobacco: Never  Vaping Use   Vaping status: Former   Substances: Flavoring  Substance and Sexual Activity   Alcohol use: Never   Drug use: Not Currently    Types: Marijuana   Sexual activity: Not Currently    Allergies  Allergen Reactions   Losartan Swelling   Pork-Derived Products      Outpatient Medications Prior to Visit  Medication Sig Dispense Refill   benztropine (COGENTIN) 0.5 MG tablet Take 1 tablet (0.5 mg total) by mouth 2 (two) times daily. For prevention of drug induced tremors 60 tablet 0   Prenatal Vit-Fe Fumarate-FA (PRENATAL MULTIVITAMIN) TABS tablet Take 1 tablet by mouth daily at 12 noon. Vitamin supplement 1 tablet 0   risperiDONE (RISPERDAL) 3 MG tablet Take 1 tablet (3 mg total) by mouth 2 (two) times daily. For mood control (Patient taking differently: Take 3 mg by mouth daily. 1/2 tablet For mood control) 30 tablet 0   No facility-administered medications prior to visit.    Review of Systems  Constitutional:  Negative for chills, diaphoresis, fever, malaise/fatigue and weight loss.  HENT:  Negative for congestion.   Respiratory:  Positive for shortness of breath. Negative for cough, hemoptysis, sputum production and wheezing.   Cardiovascular:  Positive for  chest pain. Negative for palpitations and leg swelling.     Objective:   Vitals:   07/31/23 1053  BP: 112/72  Pulse: 86  Resp: 16  SpO2: 97%  Weight: 239 lb 3.2 oz (108.5 kg)  Height: 5\' 11"  (1.803 m)  SpO2: 97 %  Physical Exam: General: Well-appearing, no acute distress HENT: [place], AT Eyes: EOMI, no scleral icterus Respiratory: Clear to auscultation bilaterally.  No crackles, wheezing or rales Cardiovascular: RRR, -M/R/G, no JVD Extremities:-Edema,-tenderness Neuro: AAO x4, CNII-XII grossly  intact Psych: Normal mood, normal affect  Data Reviewed:  Imaging: CT Chest 04/04/22 - Normal parenchymal CXR 05/30/23 - normal   PFT: 07/31/23 FVC 4.37 (76%) FEV1 3.83 (81%) Ratio 88  TLC 73% DLCO 92% Interpretation: Normal spirometry. Mild restriction with normal DLCO suggests effort or chest wall compliance   Labs: CBC    Component Value Date/Time   WBC 12.1 (H) 05/30/2023 1025   RBC 4.86 05/30/2023 1025   HGB 13.3 05/30/2023 1025   HCT 42.3 05/30/2023 1025   PLT 372.0 05/30/2023 1025   MCV 87.1 05/30/2023 1025   MCH 28.0 10/18/2022 1912   MCHC 31.5 05/30/2023 1025   RDW 14.1 05/30/2023 1025   LYMPHSABS 2.7 05/30/2023 1025   MONOABS 1.0 05/30/2023 1025   EOSABS 0.1 05/30/2023 1025   BASOSABS 0.1 05/30/2023 1025   Absolute eos  10/05/21 - 100 05/30/23 - 100, unchanged    Assessment & Plan:   Discussion: 22 year old male former smoker with schizophreniform d/o, depression who presents for shortness of breath and chest tightness that has been chronic for >1.5 years. PFTs reviewed with no obstructive defect. Mild restriction however normal DLCO so likely chest wall restriction with BMI 33. Do not suspect symptoms are of pulmonary origin.  Chest tightness Shortness of breath Reviewed PFTs. Suspect chest wall restriction. Normal spirometry and DLCO. Advised patient to discuss further work-up with PCP  Chest pain, suspect musculoskeletal Over the counter tylenol or motrin for pain control as directed Offered physical therapy however patient declined   Health Maintenance  There is no immunization history on file for this patient.   No orders of the defined types were placed in this encounter. No orders of the defined types were placed in this encounter.   Return if symptoms worsen or fail to improve.   I have spent a total time of 30-minutes on the day of the appointment including chart review, data review, collecting history, coordinating care and discussing  medical diagnosis and plan with the patient/family. Past medical history, allergies, medications were reviewed. Pertinent imaging, labs and tests included in this note have been reviewed and interpreted independently by me.  Latiana Tomei Mechele Collin, MD [facility] 07/31/2023 11:40 AM  Office Number [phone]

## 2023-08-06 ENCOUNTER — Other Ambulatory Visit: Payer: Self-pay | Admitting: Internal Medicine

## 2023-08-06 DIAGNOSIS — R1013 Epigastric pain: Secondary | ICD-10-CM

## 2023-08-09 ENCOUNTER — Ambulatory Visit
Admission: RE | Admit: 2023-08-09 | Discharge: 2023-08-09 | Disposition: A | Discharge status: Home / Self Care | Payer: MEDICAID | Source: Ambulatory Visit | Attending: Internal Medicine | Admitting: Internal Medicine

## 2023-08-09 DIAGNOSIS — R1013 Epigastric pain: Secondary | ICD-10-CM

## 2023-09-03 ENCOUNTER — Telehealth (HOSPITAL_COMMUNITY): Payer: Self-pay | Admitting: Emergency Medicine

## 2023-09-03 ENCOUNTER — Ambulatory Visit (HOSPITAL_COMMUNITY)
Admission: EM | Admit: 2023-09-03 | Discharge: 2023-09-03 | Disposition: A | Discharge status: Home / Self Care | Payer: MEDICAID

## 2023-09-03 ENCOUNTER — Encounter (HOSPITAL_COMMUNITY): Payer: Self-pay | Admitting: Emergency Medicine

## 2023-09-03 DIAGNOSIS — R110 Nausea: Secondary | ICD-10-CM | POA: Diagnosis not present

## 2023-09-03 DIAGNOSIS — K5900 Constipation, unspecified: Secondary | ICD-10-CM | POA: Insufficient documentation

## 2023-09-03 DIAGNOSIS — R1011 Right upper quadrant pain: Secondary | ICD-10-CM | POA: Insufficient documentation

## 2023-09-03 LAB — CBC
HCT: 45.6 % (ref 39.0–52.0)
Hemoglobin: 14.3 g/dL (ref 13.0–17.0)
MCH: 27.2 pg (ref 26.0–34.0)
MCHC: 31.4 g/dL (ref 30.0–36.0)
MCV: 86.7 fL (ref 80.0–100.0)
Platelets: 362 10*3/uL (ref 150–400)
RBC: 5.26 MIL/uL (ref 4.22–5.81)
RDW: 14.2 % (ref 11.5–15.5)
WBC: 10.9 10*3/uL — ABNORMAL HIGH (ref 4.0–10.5)
nRBC: 0 % (ref 0.0–0.2)

## 2023-09-03 LAB — COMPREHENSIVE METABOLIC PANEL
ALT: 12 U/L (ref 0–44)
AST: 19 U/L (ref 15–41)
Albumin: 4.1 g/dL (ref 3.5–5.0)
Alkaline Phosphatase: 113 U/L (ref 38–126)
Anion gap: 15 (ref 5–15)
BUN: 6 mg/dL (ref 6–20)
CO2: 24 mmol/L (ref 22–32)
Calcium: 9.6 mg/dL (ref 8.9–10.3)
Chloride: 97 mmol/L — ABNORMAL LOW (ref 98–111)
Creatinine, Ser: 0.76 mg/dL (ref 0.61–1.24)
GFR, Estimated: >60 mL/min (ref >60)
Glucose, Bld: 27 mg/dL — CL (ref 70–99)
Potassium: 3.5 mmol/L (ref 3.5–5.1)
Sodium: 136 mmol/L (ref 135–145)
Total Bilirubin: 0.8 mg/dL (ref 0.0–1.2)
Total Protein: 7.7 g/dL (ref 6.5–8.1)

## 2023-09-03 MED ORDER — ONDANSETRON HCL 4 MG PO TABS: 4.0000 mg | ORAL_TABLET | Freq: Three times a day (TID) | ORAL | 0 refills | Status: DC | PRN

## 2023-09-03 NOTE — Discharge Instructions (Addendum)
 Follow up with PCP and may call and schedule an appointment with GI as above. Use medication prescribed by PCP for constipation and I have also sent you medication for nausea. Please continue follow ups with PCP for medication adjustments as these may be contributing to your abdominal discomfort. We will follow up with you for any abnormal lab work.

## 2023-09-03 NOTE — Telephone Encounter (Signed)
 Was notified by lab that patient had a critical glucose of 27. Henderson Cloud, NP aware. Instructed to contact patient to see how was feeling.

## 2023-09-03 NOTE — Telephone Encounter (Signed)
 Called patient and verified using three identifiers. Asked patient how he was feeling. Patient states that he is feeling the same as he was when was in office. Asked patient if he had a way at home to check his blood sugar due to having a critical glucose result of 27. Patient reports that he did have a way at home to check it, if not then [place], NP advised for patient to return to office for recollect. Pt reports that he has only had water  today and nothing else. Pt checked CBG at home and reports level of 78. Jerre Pippin, NP aware. Was instructed for patient to make sure eats and drinks something besides water  and to monitor blood sugar ever several house to make doesn't get too low. Instructed patient that if level was to get low then needs to be seen immediately. Patient verbalized understanding.

## 2023-09-03 NOTE — ED Provider Notes (Addendum)
 [facility]    CSN: [id number] Arrival date & time: 09/03/23  9196      History   Chief Complaint Chief Complaint  Patient presents with   Abdominal Pain    HPI Jon Branch is a 23 y.o. male reporting abdominal pain.   Presenting with 2-3 months. He notes that this pain is in his RUQ and typically worse when he eats sugar or oily foods. RUQ feels full, nausea, when he sleeps on the right side he has more pain and belching.  He states that his belches smell weird. He is currently only eating one meal per day, he mainly eats vegetables and has stopped eating meat as he feels that it is oily/fatty and contributes to his discomfort. He states that he notices a weird smell when he belches.  He did recently have a abdominal ultrasound that showed no issues with his gallbladder.  He did see his PCP yesterday who prescribed him some medication for constipation.  He is currently having a bowel movement every other day to every 2 days.  He is currently compliant with all of his medications.  At this time he is currently not having any abdominal pain.  He would like to have his liver enzymes checked.   The history is provided by the patient.    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 Authorizing Provider  amLODipine (NORVASC) 5 MG tablet Take 5 mg by mouth daily. 03/27/23  Yes [provider]  ondansetron  (ZOFRAN ) 4 MG  tablet Take 1 tablet (4 mg total) by mouth every 8 (eight) hours as needed for nausea or vomiting. 09/03/23  Yes Remi Pippin, NP  benztropine  (COGENTIN ) 0.5 MG tablet Take 1 tablet (0.5 mg total) by mouth 2 (two) times daily. For prevention of drug induced tremors 10/16/18   Collene Gouge I, NP  Prenatal Vit-Fe Fumarate-FA (PRENATAL MULTIVITAMIN) TABS tablet Take 1 tablet by mouth daily at 12 noon. Vitamin supplement 10/16/18   Collene Gouge I, NP  risperiDONE  (RISPERDAL ) 3 MG tablet Take 1 tablet (3 mg total) by mouth 2 (two) times daily. For mood control Patient taking differently: Take 3 mg by mouth daily. 1/2 tablet For mood control 10/16/18   Collene Gouge I, NP    Family History Family History  Problem Relation Age of Onset   High blood pressure Mother    Diabetes Mother    High blood pressure Father    High blood pressure Maternal Grandmother    Diabetes Maternal Grandmother    High blood pressure Paternal Grandmother    Colon cancer Neg Hx     Social History Social History   Tobacco Use   Smoking status: Former    Types: Cigarettes   Smokeless tobacco: Never  Vaping Use   Vaping status: Former   Substances: Flavoring  Substance Use Topics   Alcohol use: Never   Drug  use: Not Currently    Types: Marijuana     Allergies   Losartan and Pork-derived products   Review of Systems Review of Systems   Physical Exam Triage Vital Signs ED Triage Vitals  Encounter Vitals Group     BP      Systolic BP Percentile      Diastolic BP Percentile      Pulse      Resp      Temp      Temp src      SpO2      Weight      Height      Head Circumference      Peak Flow      Pain Score      Pain Loc      Pain Education      Exclude from Growth Chart    No data found.  Updated Vital Signs BP 116/78 (BP Location: Left Arm)   Pulse 85   Temp 97.7 F (36.5 C) (Oral)   Resp 15   SpO2 96%   Visual Acuity Right Eye Distance:   Left Eye Distance:   Bilateral Distance:     Right Eye Near:   Left Eye Near:    Bilateral Near:     Physical Exam Constitutional:      Appearance: He is well-developed and normal weight.  HENT:     Head: Normocephalic.  Cardiovascular:     Rate and Rhythm: Normal rate and regular rhythm.  Pulmonary:     Effort: Pulmonary effort is normal.     Breath sounds: Normal breath sounds.  Abdominal:     General: Abdomen is flat. Bowel sounds are normal. There is no distension.     Palpations: Abdomen is soft. There is no hepatomegaly.     Tenderness: There is no abdominal tenderness.  Skin:    General: Skin is warm and dry.  Neurological:     General: No focal deficit present.     Mental Status: He is alert and oriented to person, place, and time.  Psychiatric:        Mood and Affect: Mood normal.        Behavior: Behavior normal.      UC Treatments / Results  Labs (all labs ordered are listed, but only abnormal results are displayed) Labs Reviewed  COMPREHENSIVE METABOLIC PANEL  CBC    EKG   Radiology No results found.  Procedures Procedures (including critical care time)  Medications Ordered in UC Medications - No data to display  Initial Impression / Assessment and Plan / UC Course  I have reviewed the triage vital signs and the nursing notes.  Pertinent labs & imaging results that were available during my care of the patient were reviewed by me and considered in my medical decision making (see chart for details). Patient presenting with 2-3 months right upper quadrant abdominal pain and nausea after he eats.  Given the information for [facility] and I did prescribe him some Zofran  to help with the nausea after he eats.  CBC and CMP ordered.  We will follow-up with any abnormal lab results. Additionally recommend continued follow up with PCP for medication related side effects. Discussed red flag symptoms regarding need for ED visit.        Final Clinical Impressions(s) / UC Diagnoses   Final diagnoses:   Right upper quadrant abdominal pain  Nausea     Discharge Instructions  Follow up with PCP and may call and schedule an appointment with GI as above. Use medication prescribed by PCP for constipation and I have also sent you medication for nausea. Please continue follow ups with PCP for medication adjustments as these may be contributing to your abdominal discomfort. We will follow up with you for any abnormal lab work.      ED Prescriptions     Medication Sig Dispense Auth. Provider   ondansetron  (ZOFRAN ) 4 MG tablet Take 1 tablet (4 mg total) by mouth every 8 (eight) hours as needed for nausea or vomiting. 12 tablet Remi Pippin, NP      PDMP not reviewed this encounter.   Remi Pippin, NP 09/03/23 9077    Remi Pippin, NP 09/03/23 1732

## 2023-09-03 NOTE — ED Triage Notes (Signed)
 Pt c/o RUQ for 2 months that is worse after eating. Reports "had a picture and showed nothing on gallbladder two weeks ago". "Pt states "there is a very weird smell that comes out my mouth and in my sensitive area".

## 2023-11-20 ENCOUNTER — Other Ambulatory Visit: Payer: Self-pay

## 2023-11-20 ENCOUNTER — Emergency Department (HOSPITAL_COMMUNITY): Payer: MEDICAID

## 2023-11-20 ENCOUNTER — Emergency Department (HOSPITAL_COMMUNITY)
Admission: EM | Admit: 2023-11-20 | Discharge: 2023-11-20 | Disposition: A | Discharge status: Home / Self Care | Payer: MEDICAID | Attending: Emergency Medicine | Admitting: Emergency Medicine

## 2023-11-20 ENCOUNTER — Encounter (HOSPITAL_COMMUNITY): Payer: Self-pay

## 2023-11-20 DIAGNOSIS — R1011 Right upper quadrant pain: Secondary | ICD-10-CM | POA: Insufficient documentation

## 2023-11-20 DIAGNOSIS — D72829 Elevated white blood cell count, unspecified: Secondary | ICD-10-CM | POA: Diagnosis not present

## 2023-11-20 DIAGNOSIS — Z79899 Other long term (current) drug therapy: Secondary | ICD-10-CM | POA: Diagnosis not present

## 2023-11-20 DIAGNOSIS — I10 Essential (primary) hypertension: Secondary | ICD-10-CM | POA: Diagnosis not present

## 2023-11-20 LAB — COMPREHENSIVE METABOLIC PANEL WITH GFR
ALT: 23 U/L (ref 0–44)
AST: 25 U/L (ref 15–41)
Albumin: 3.7 g/dL (ref 3.5–5.0)
Alkaline Phosphatase: 96 U/L (ref 38–126)
Anion gap: 9 (ref 5–15)
BUN: 8 mg/dL (ref 6–20)
CO2: 23 mmol/L (ref 22–32)
Calcium: 9.4 mg/dL (ref 8.9–10.3)
Chloride: 107 mmol/L (ref 98–111)
Creatinine, Ser: 0.69 mg/dL (ref 0.61–1.24)
GFR, Estimated: >60 mL/min (ref >60)
Glucose, Bld: 97 mg/dL (ref 70–99)
Potassium: 4.2 mmol/L (ref 3.5–5.1)
Sodium: 139 mmol/L (ref 135–145)
Total Bilirubin: 0.5 mg/dL (ref 0.0–1.2)
Total Protein: 7.2 g/dL (ref 6.5–8.1)

## 2023-11-20 LAB — CBC WITH DIFFERENTIAL/PLATELET
Abs Immature Granulocytes: 0.03 10*3/uL (ref 0.00–0.07)
Basophils Absolute: 0.1 10*3/uL (ref 0.0–0.1)
Basophils Relative: 1 %
Eosinophils Absolute: 0.2 10*3/uL (ref 0.0–0.5)
Eosinophils Relative: 2 %
HCT: 45.2 % (ref 39.0–52.0)
Hemoglobin: 14.3 g/dL (ref 13.0–17.0)
Immature Granulocytes: 0 %
Lymphocytes Relative: 23 %
Lymphs Abs: 2.4 10*3/uL (ref 0.7–4.0)
MCH: 27.7 pg (ref 26.0–34.0)
MCHC: 31.6 g/dL (ref 30.0–36.0)
MCV: 87.4 fL (ref 80.0–100.0)
Monocytes Absolute: 0.7 10*3/uL (ref 0.1–1.0)
Monocytes Relative: 7 %
Neutro Abs: 7.1 10*3/uL (ref 1.7–7.7)
Neutrophils Relative %: 67 %
Platelets: 347 10*3/uL (ref 150–400)
RBC: 5.17 MIL/uL (ref 4.22–5.81)
RDW: 13.9 % (ref 11.5–15.5)
WBC: 10.6 10*3/uL — ABNORMAL HIGH (ref 4.0–10.5)
nRBC: 0 % (ref 0.0–0.2)

## 2023-11-20 LAB — LIPASE, BLOOD: Lipase: 28 U/L (ref 11–51)

## 2023-11-20 LAB — MAGNESIUM: Magnesium: 1.9 mg/dL (ref 1.7–2.4)

## 2023-11-20 NOTE — ED Triage Notes (Signed)
 Patient concern of URQ abdominal pain that started last night with increasing pain this morning. Patient states it became worse after having some oily foods last night. Pain 5/10.

## 2023-11-20 NOTE — ED Provider Notes (Signed)
 [place] [facility] Provider Note   CSN: [id number] Arrival date & time: 11/20/23  0941     History  Chief Complaint  Patient presents with   Abdominal Pain    Jon Branch is a 23 y.o. male.  HPI Patient presents for right upper quadrant abdominal pain. Medical history includes HTN, GERD, depression, cannabis use, schizophreniform disorder.  He has been seen by [facility] gastroenterology in the past.  He underwent upper endoscopy with esophageal dilation a year and a half ago. He states that he has had intermittent symptoms of right upper quadrant pain for the past several months.  This is typically after eating fatty meal.  Last night, he had a fatty meal and subsequently had pain in right upper quadrant, radiating to right scapula.  He had some nausea but did not vomit.  He laid down and describes a sensation of fluid in his right upper quadrant.  Although his symptoms have resolved today, patient was concerned of the severity of symptoms last night.  He has not tried to eat anything yet today.    Home Medications Prior to Admission medications   Medication Sig Start Date End Date Taking? Authorizing Provider  amLODipine (NORVASC) 5 MG tablet Take 5 mg by mouth daily. 03/27/23   [provider]  benztropine (COGENTIN) 0.5 MG tablet Take 1 tablet (0.5 mg total) by mouth 2 (two) times daily. For prevention of drug induced tremors 10/16/18   Armandina Stammer I, NP  ondansetron (ZOFRAN) 4 MG tablet Take 1 tablet (4 mg total) by mouth every 8 (eight) hours as needed for nausea or vomiting. 09/03/23   Elmer Picker, NP  Prenatal Vit-Fe Fumarate-FA (PRENATAL MULTIVITAMIN) TABS tablet Take 1 tablet by mouth daily at 12 noon. Vitamin supplement 10/16/18   Armandina Stammer I, NP  risperiDONE (RISPERDAL) 3 MG tablet Take 1 tablet (3 mg total) by mouth 2 (two) times daily. For mood control Patient taking differently: Take 3 mg by mouth daily. 1/2 tablet For mood  control 10/16/18   Armandina Stammer I, NP      Allergies    Losartan and Pork-derived products    Review of Systems   Review of Systems  Gastrointestinal:  Positive for abdominal pain and nausea.  All other systems reviewed and are negative.   Physical Exam Updated Vital Signs BP 121/77   Pulse 78   Temp 98.2 F (36.8 C) (Oral)   Resp 18   Ht 5\' 11"  (1.803 m)   Wt 98.9 kg   SpO2 100%   BMI 30.40 kg/m  Physical Exam Vitals and nursing note reviewed.  Constitutional:      General: He is not in acute distress.    Appearance: He is well-developed. He is not ill-appearing, toxic-appearing or diaphoretic.  HENT:     Head: Normocephalic and atraumatic.     Mouth/Throat:     Mouth: Mucous membranes are moist.  Eyes:     General: No scleral icterus.    Extraocular Movements: Extraocular movements intact.     Conjunctiva/sclera: Conjunctivae normal.  Cardiovascular:     Rate and Rhythm: Normal rate and regular rhythm.  Pulmonary:     Effort: Pulmonary effort is normal. No respiratory distress.  Abdominal:     Palpations: Abdomen is soft.     Tenderness: There is no abdominal tenderness.  Musculoskeletal:        General: No swelling.     Cervical back: Neck supple.  Skin:  General: Skin is warm and dry.     Coloration: Skin is not jaundiced or pale.  Neurological:     General: No focal deficit present.     Mental Status: He is alert and oriented to person, place, and time.  Psychiatric:        Mood and Affect: Mood normal.        Behavior: Behavior normal.     ED Results / Procedures / Treatments   Labs (all labs ordered are listed, but only abnormal results are displayed) Labs Reviewed  CBC WITH DIFFERENTIAL/PLATELET - Abnormal; Notable for the following components:      Result Value   WBC 10.6 (*)    All other components within normal limits  COMPREHENSIVE METABOLIC PANEL WITH GFR  LIPASE, BLOOD  MAGNESIUM    EKG None  Radiology US Abdomen Limited RUQ  (LIVER/GB) Result Date: 11/20/2023 CLINICAL DATA:  Right upper quadrant pain EXAM: ULTRASOUND ABDOMEN LIMITED RIGHT UPPER QUADRANT COMPARISON:  Ultrasound 08/09/2023 FINDINGS: Gallbladder: No gallstones or wall thickening visualized. No sonographic Murphy sign noted by sonographer. Questionable debris in the cystic duct. Common bile duct: Diameter: 4 mm Liver: Homogeneous hepatic parenchyma. There is an echogenic areas seen anteriorly in the right hepatic lobe measuring 2.3 x 1.3 x 1.9 cm. This is nodular in contour. No through transmission. This was not clearly seen previously. Portal vein is patent on color Doppler imaging with normal direction of blood flow towards the liver. Other: None. IMPRESSION: No gallstones or ductal dilatation. Focal echogenic area in the right hepatic lobe not clearly seen previously. Recommend confirmatory study with MRI with and without contrast when appropriate. Slight for question some debris in the cystic duct of the gallbladder. Also could be assessed with MRI, MRCP. Electronically Signed   By: Karen Kays M.D.   On: 11/20/2023 11:06    Procedures Procedures    Medications Ordered in ED Medications - No data to display  ED Course/ Medical Decision Making/ A&P                                 Medical Decision Making Amount and/or Complexity of Data Reviewed Labs: ordered. Radiology: ordered.   This patient presents to the ED for concern of right upper quadrant abdominal pain, this involves an extensive number of treatment options, and is a complaint that carries with it a high risk of complications and morbidity.  The differential diagnosis includes cholecystitis, cholelithiasis, pancreatitis, PUD   Co morbidities that complicate the patient evaluation  HTN, GERD, depression, cannabis use, schizophreniform disorder   Additional history obtained:  Additional history obtained from N/A External records from outside source obtained and reviewed including  EMR   Lab Tests:  I Ordered, and personally interpreted labs.  The pertinent results include: Slight leukocytosis, normal hepatobiliary enzymes   Imaging Studies ordered:  I ordered imaging studies including right upper quadrant ultrasound I independently visualized and interpreted imaging which showed nonspecific focal echogenic area in right hepatic lobe, questionable debris in cystic duct with no evidence of dilation, no gallstones I agree with the radiologist interpretation   Cardiac Monitoring: / EKG:  The patient was maintained on a cardiac monitor.  I personally viewed and interpreted the cardiac monitored which showed an underlying rhythm of: Sinus rhythm   Problem List / ED Course / Critical interventions / Medication management  Patient presenting for intermittent right upper quadrant abdominal pain and nausea.  Had a severe episode last night which has since resolved.  On arrival in the ED, patient is well-appearing.  Abdomen is soft and nontender, including right upper quadrant.  Lab work and ultrasound were ordered.  Patient WBC was high range of normal.  Hepatobiliary enzymes were normal.  On right upper quadrant ultrasound, no gallstones were identified.  There were no areas of ductal dilatation.  There was some questionable debris and cystic duct as well as a nonspecific focal echogenic area in the right hepatic lobe.  Patient remained asymptomatic while in the ED.  He was advised to follow-up with his gastroenterology office for consideration of further outpatient imaging.  He was discharged in stable condition.  Social Determinants of Health:  Lives independently        Final Clinical Impression(s) / ED Diagnoses Final diagnoses:  Right upper quadrant abdominal pain    Rx / DC Orders ED Discharge Orders     None         Gloris Manchester, MD 11/20/23 1219

## 2023-11-20 NOTE — ED Notes (Signed)
 Patient verbalized understanding of discharge instructions, patient verbalized he will call GI doctor to make an appointment to get MRI. Patient does not have any other concerns at this time. Patient ambulated to ED lobby.

## 2023-11-20 NOTE — Discharge Instructions (Signed)
 Test results today are reassuring.  Your gallbladder was normal in appearance on ultrasound.  There was a spot on your liver that could be better evaluated with an MRI.  You should follow-up with your gastroenterology office for consideration of further imaging studies.  Call the telephone number below to set up that appointment.  Return to the emergency department for any new or worsening symptoms of concern.

## 2023-11-20 NOTE — ED Notes (Signed)
 Went to ultra sound

## 2023-12-24 NOTE — Progress Notes (Deleted)
 12/24/2023 Jon Branch [id number] Feb 21, 2001  Referring provider: Nohemi Batters, MD Primary GI doctor: Dr. Karene Oto  ASSESSMENT AND PLAN:  Right upper quadrant abdominal pain Patient's had 2 ER visits on 4/2, CBC with leukocytosis 10.6 no left shift, normal LFTs, lipase normal  11/20/2023 RUQ US  no gallstones no ductal dilation right hepatic lobe focal echogenic area not clearly seen recommend MRI with and without, slight question some debris in cystic duct of the gallbladder could also assess with MRI/MRCP  Dysphagia with history of GERD 03/27/2022 EGD normal esophagus, empirically dilated 18 mm normal stomach normal duodenum no specimens collected  Cannabinoid use Patient Care Team: Nohemi Batters, MD as PCP - General (Internal Medicine) Patient, No Pcp Per (General Practice)  HISTORY OF PRESENT ILLNESS: 23 y.o. male with a past medical history listed below presents for evaluation of ER follow-up for abdominal pain.  Last seen in the office 03/20/2022 for dysphagia by Dr. Karene Oto.  *** Discussed the use of AI scribe software for clinical note transcription with the patient, who gave verbal consent to proceed.  History of Present Illness            He  reports that he has quit smoking. His smoking use included cigarettes. He has never used smokeless tobacco. He reports that he does not currently use drugs after having used the following drugs: Marijuana. He reports that he does not drink alcohol.  RELEVANT GI HISTORY, IMAGING AND LABS: Results          CBC    Component Value Date/Time   WBC 10.6 (H) 11/20/2023 1110   RBC 5.17 11/20/2023 1110   HGB 14.3 11/20/2023 1110   HCT 45.2 11/20/2023 1110   PLT 347 11/20/2023 1110   MCV 87.4 11/20/2023 1110   MCH 27.7 11/20/2023 1110   MCHC 31.6 11/20/2023 1110   RDW 13.9 11/20/2023 1110   LYMPHSABS 2.4 11/20/2023 1110   MONOABS 0.7 11/20/2023 1110   EOSABS 0.2 11/20/2023 1110   BASOSABS 0.1 11/20/2023 1110    Recent Labs    05/30/23 1025 09/03/23 1601 11/20/23 1110  HGB 13.3 14.3 14.3    CMP     Component Value Date/Time   NA 139 11/20/2023 1110   K 4.2 11/20/2023 1110   CL 107 11/20/2023 1110   CO2 23 11/20/2023 1110   GLUCOSE 97 11/20/2023 1110   BUN 8 11/20/2023 1110   CREATININE 0.69 11/20/2023 1110   CALCIUM 9.4 11/20/2023 1110   PROT 7.2 11/20/2023 1110   ALBUMIN 3.7 11/20/2023 1110   AST 25 11/20/2023 1110   ALT 23 11/20/2023 1110   ALKPHOS 96 11/20/2023 1110   BILITOT 0.5 11/20/2023 1110   GFRNONAA >60 11/20/2023 1110   GFRAA >60 10/29/2018 1303      Latest Ref Rng & Units 11/20/2023   11:10 AM 09/03/2023    4:01 PM 10/18/2022    7:12 PM  Hepatic Function  Total Protein 6.5 - 8.1 g/dL 7.2  7.7  7.9   Albumin 3.5 - 5.0 g/dL 3.7  4.1  4.1   AST 15 - 41 U/L 25  19  17    ALT 0 - 44 U/L 23  12  14    Alk Phosphatase 38 - 126 U/L 96  113  96   Total Bilirubin 0.0 - 1.2 mg/dL 0.5  0.8  0.5       Current Medications:    Current Outpatient Medications (Cardiovascular):    amLODipine (NORVASC) 5  MG tablet, Take 5 mg by mouth daily.     Current Outpatient Medications (Other):    benztropine  (COGENTIN ) 0.5 MG tablet, Take 1 tablet (0.5 mg total) by mouth 2 (two) times daily. For prevention of drug induced tremors   ondansetron  (ZOFRAN ) 4 MG tablet, Take 1 tablet (4 mg total) by mouth every 8 (eight) hours as needed for nausea or vomiting.   Prenatal Vit-Fe Fumarate-FA (PRENATAL MULTIVITAMIN) TABS tablet, Take 1 tablet by mouth daily at 12 noon. Vitamin supplement   risperiDONE  (RISPERDAL ) 3 MG tablet, Take 1 tablet (3 mg total) by mouth 2 (two) times daily. For mood control (Patient taking differently: Take 3 mg by mouth daily. 1/2 tablet For mood control)  Medical History:  Past Medical History:  Diagnosis Date   GERD (gastroesophageal reflux disease)    Hypertension    Allergies:  Allergies  Allergen Reactions   Losartan Swelling   Pork-Derived Products       Surgical History:  He  has a past surgical history that includes Eye surgery. Family History:  His family history includes Diabetes in his maternal grandmother and mother; High blood pressure in his father, maternal grandmother, mother, and paternal grandmother.  REVIEW OF SYSTEMS  : All other systems reviewed and negative except where noted in the History of Present Illness.  PHYSICAL EXAM: There were no vitals taken for this visit. Physical Exam          Edmonia Gottron, PA-C 4:06 PM

## 2023-12-25 ENCOUNTER — Ambulatory Visit: Payer: MEDICAID | Admitting: Physician Assistant

## 2024-07-25 ENCOUNTER — Encounter (HOSPITAL_COMMUNITY): Payer: Self-pay

## 2024-07-25 ENCOUNTER — Other Ambulatory Visit: Payer: Self-pay

## 2024-07-25 ENCOUNTER — Emergency Department (HOSPITAL_COMMUNITY)
Admission: EM | Admit: 2024-07-25 | Discharge: 2024-07-26 | Disposition: A | Payer: MEDICAID | Attending: Emergency Medicine | Admitting: Emergency Medicine

## 2024-07-25 DIAGNOSIS — I10 Essential (primary) hypertension: Secondary | ICD-10-CM | POA: Diagnosis not present

## 2024-07-25 DIAGNOSIS — R109 Unspecified abdominal pain: Secondary | ICD-10-CM | POA: Insufficient documentation

## 2024-07-25 DIAGNOSIS — M5442 Lumbago with sciatica, left side: Secondary | ICD-10-CM | POA: Diagnosis not present

## 2024-07-25 DIAGNOSIS — Z79899 Other long term (current) drug therapy: Secondary | ICD-10-CM | POA: Diagnosis not present

## 2024-07-25 DIAGNOSIS — R809 Proteinuria, unspecified: Secondary | ICD-10-CM | POA: Diagnosis not present

## 2024-07-25 DIAGNOSIS — D72829 Elevated white blood cell count, unspecified: Secondary | ICD-10-CM | POA: Diagnosis not present

## 2024-07-25 DIAGNOSIS — R824 Acetonuria: Secondary | ICD-10-CM | POA: Insufficient documentation

## 2024-07-25 DIAGNOSIS — M545 Low back pain, unspecified: Secondary | ICD-10-CM | POA: Diagnosis present

## 2024-07-25 LAB — COMPREHENSIVE METABOLIC PANEL WITH GFR
ALT: 15 U/L (ref 0–44)
AST: 31 U/L (ref 15–41)
Albumin: 4.1 g/dL (ref 3.5–5.0)
Alkaline Phosphatase: 88 U/L (ref 38–126)
Anion gap: 14 (ref 5–15)
BUN: 14 mg/dL (ref 6–20)
CO2: 21 mmol/L — ABNORMAL LOW (ref 22–32)
Calcium: 9.8 mg/dL (ref 8.9–10.3)
Chloride: 102 mmol/L (ref 98–111)
Creatinine, Ser: 0.75 mg/dL (ref 0.61–1.24)
GFR, Estimated: >60 mL/min (ref >60)
Glucose, Bld: 114 mg/dL — ABNORMAL HIGH (ref 70–99)
Potassium: 4 mmol/L (ref 3.5–5.1)
Sodium: 137 mmol/L (ref 135–145)
Total Bilirubin: 0.6 mg/dL (ref 0.0–1.2)
Total Protein: 8.6 g/dL — ABNORMAL HIGH (ref 6.5–8.1)

## 2024-07-25 LAB — URINALYSIS, ROUTINE W REFLEX MICROSCOPIC
Glucose, UA: NEGATIVE mg/dL
Ketones, ur: 20 mg/dL — AB
Leukocytes,Ua: NEGATIVE
Nitrite: NEGATIVE
Protein, ur: >=300 mg/dL — AB
Specific Gravity, Urine: 1.04 — ABNORMAL HIGH (ref 1.005–1.030)
pH: 5 (ref 5.0–8.0)

## 2024-07-25 LAB — CBC WITH DIFFERENTIAL/PLATELET
Abs Immature Granulocytes: 0.05 K/uL (ref 0.00–0.07)
Basophils Absolute: 0.1 K/uL (ref 0.0–0.1)
Basophils Relative: 0 %
Eosinophils Absolute: 0 K/uL (ref 0.0–0.5)
Eosinophils Relative: 0 %
HCT: 44.5 % (ref 39.0–52.0)
Hemoglobin: 14.7 g/dL (ref 13.0–17.0)
Immature Granulocytes: 0 %
Lymphocytes Relative: 10 %
Lymphs Abs: 1.8 K/uL (ref 0.7–4.0)
MCH: 28.4 pg (ref 26.0–34.0)
MCHC: 33 g/dL (ref 30.0–36.0)
MCV: 85.9 fL (ref 80.0–100.0)
Monocytes Absolute: 1.1 K/uL — ABNORMAL HIGH (ref 0.1–1.0)
Monocytes Relative: 6 %
Neutro Abs: 14.8 K/uL — ABNORMAL HIGH (ref 1.7–7.7)
Neutrophils Relative %: 84 %
Platelets: 433 K/uL — ABNORMAL HIGH (ref 150–400)
RBC: 5.18 MIL/uL (ref 4.22–5.81)
RDW: 12.8 % (ref 11.5–15.5)
WBC: 17.8 K/uL — ABNORMAL HIGH (ref 4.0–10.5)
nRBC: 0 % (ref 0.0–0.2)

## 2024-07-25 LAB — RESP PANEL BY RT-PCR (RSV, FLU A&B, COVID)  RVPGX2
Influenza A by PCR: NEGATIVE
Influenza B by PCR: NEGATIVE
Resp Syncytial Virus by PCR: NEGATIVE
SARS Coronavirus 2 by RT PCR: NEGATIVE

## 2024-07-25 MED ORDER — LACTATED RINGERS IV BOLUS
1000.0000 mL | Freq: Once | INTRAVENOUS | Status: AC
  Administered 2024-07-26: 1000 mL via INTRAVENOUS

## 2024-07-25 NOTE — ED Triage Notes (Signed)
 Pt states he has back pain x10 days/ not eating x3/ constipation x5 days/ denies difficulty swallowing, pt describes as feeling full.

## 2024-07-25 NOTE — ED Notes (Signed)
 Pt also reports feeling like he has the flu.

## 2024-07-25 NOTE — ED Triage Notes (Signed)
 Pt states he has been constipated for 3 days.  He is experiencing abdominal pain, back pain and leg heaviness.  Also c/o headache and difficulty swallowing, though denies sore throat and fevers.

## 2024-07-26 ENCOUNTER — Emergency Department (HOSPITAL_COMMUNITY): Payer: MEDICAID

## 2024-07-26 MED ORDER — METHOCARBAMOL 500 MG PO TABS: 500.0000 mg | ORAL_TABLET | Freq: Two times a day (BID) | ORAL | 0 refills | Status: DC | PRN

## 2024-07-26 MED ORDER — NAPROXEN 500 MG PO TABS: 500.0000 mg | ORAL_TABLET | Freq: Two times a day (BID) | ORAL | 0 refills | Status: DC | PRN

## 2024-07-26 MED ORDER — KETOROLAC TROMETHAMINE 15 MG/ML IJ SOLN
15.0000 mg | Freq: Once | INTRAMUSCULAR | Status: AC
  Administered 2024-07-26: 15 mg via INTRAVENOUS
  Filled 2024-07-26: qty 1

## 2024-07-26 NOTE — Discharge Instructions (Addendum)
Alternate ice and heat to areas of injury 3-4 times per day to limit inflammation and spasm.  Avoid strenuous activity and heavy lifting.  We recommend consistent use of naproxen in addition to Robaxin for muscle spasms. Do not drive or drink alcohol after taking Robaxin as it may make you drowsy and impair your judgment.  We recommend follow-up with a primary care doctor to ensure resolution of symptoms.  Return to the ED for any new or concerning symptoms. 

## 2024-07-26 NOTE — ED Notes (Signed)
 Pt to CT

## 2024-08-01 ENCOUNTER — Encounter (HOSPITAL_COMMUNITY): Payer: Self-pay | Admitting: *Deleted

## 2024-08-01 ENCOUNTER — Emergency Department (HOSPITAL_COMMUNITY)
Admission: EM | Admit: 2024-08-01 | Discharge: 2024-08-02 | Disposition: A | Discharge status: Home / Self Care | Payer: MEDICAID | Attending: Emergency Medicine | Admitting: Emergency Medicine

## 2024-08-01 ENCOUNTER — Other Ambulatory Visit: Payer: Self-pay

## 2024-08-01 DIAGNOSIS — Z79899 Other long term (current) drug therapy: Secondary | ICD-10-CM | POA: Diagnosis not present

## 2024-08-01 DIAGNOSIS — K5900 Constipation, unspecified: Secondary | ICD-10-CM

## 2024-08-01 DIAGNOSIS — R109 Unspecified abdominal pain: Secondary | ICD-10-CM

## 2024-08-01 DIAGNOSIS — I10 Essential (primary) hypertension: Secondary | ICD-10-CM | POA: Diagnosis not present

## 2024-08-01 DIAGNOSIS — D72829 Elevated white blood cell count, unspecified: Secondary | ICD-10-CM | POA: Diagnosis not present

## 2024-08-01 NOTE — ED Triage Notes (Signed)
 The pt has not had  a bm for one week abd pain

## 2024-08-02 ENCOUNTER — Emergency Department (HOSPITAL_COMMUNITY): Payer: MEDICAID

## 2024-08-02 LAB — CBC
HCT: 46.2 % (ref 39.0–52.0)
Hemoglobin: 14.9 g/dL (ref 13.0–17.0)
MCH: 28.7 pg (ref 26.0–34.0)
MCHC: 32.3 g/dL (ref 30.0–36.0)
MCV: 89 fL (ref 80.0–100.0)
Platelets: 378 K/uL (ref 150–400)
RBC: 5.19 MIL/uL (ref 4.22–5.81)
RDW: 12.6 % (ref 11.5–15.5)
WBC: 17.4 K/uL — ABNORMAL HIGH (ref 4.0–10.5)
nRBC: 0 % (ref 0.0–0.2)

## 2024-08-02 LAB — URINALYSIS, ROUTINE W REFLEX MICROSCOPIC
Bacteria, UA: NONE SEEN
Bilirubin Urine: NEGATIVE
Glucose, UA: NEGATIVE mg/dL
Ketones, ur: 80 mg/dL — AB
Leukocytes,Ua: NEGATIVE
Nitrite: NEGATIVE
Protein, ur: 100 mg/dL — AB
Specific Gravity, Urine: 1.036 — ABNORMAL HIGH (ref 1.005–1.030)
pH: 5 (ref 5.0–8.0)

## 2024-08-02 LAB — COMPREHENSIVE METABOLIC PANEL WITH GFR
ALT: 18 U/L (ref 0–44)
AST: 17 U/L (ref 15–41)
Albumin: 3.9 g/dL (ref 3.5–5.0)
Alkaline Phosphatase: 95 U/L (ref 38–126)
Anion gap: 19 — ABNORMAL HIGH (ref 5–15)
BUN: 16 mg/dL (ref 6–20)
CO2: 21 mmol/L — ABNORMAL LOW (ref 22–32)
Calcium: 9.8 mg/dL (ref 8.9–10.3)
Chloride: 99 mmol/L (ref 98–111)
Creatinine, Ser: 0.92 mg/dL (ref 0.61–1.24)
GFR, Estimated: >60 mL/min (ref >60)
Glucose, Bld: 81 mg/dL (ref 70–99)
Potassium: 4.1 mmol/L (ref 3.5–5.1)
Sodium: 139 mmol/L (ref 135–145)
Total Bilirubin: 1.3 mg/dL — ABNORMAL HIGH (ref 0.0–1.2)
Total Protein: 7.9 g/dL (ref 6.5–8.1)

## 2024-08-02 LAB — LIPASE, BLOOD: Lipase: 25 U/L (ref 11–51)

## 2024-08-02 MED ORDER — SODIUM CHLORIDE 0.9 % IV BOLUS
1000.0000 mL | Freq: Once | INTRAVENOUS | Status: AC
  Administered 2024-08-02: 1000 mL via INTRAVENOUS

## 2024-08-02 MED ORDER — IOHEXOL 350 MG/ML SOLN
75.0000 mL | Freq: Once | INTRAVENOUS | Status: AC | PRN
  Administered 2024-08-02: 75 mL via INTRAVENOUS

## 2024-08-02 MED ORDER — MAGNESIUM CITRATE PO SOLN: 1.0000 | Freq: Once | ORAL | 1 refills | Status: AC

## 2024-08-02 NOTE — ED Notes (Signed)
 Called CT to make them aware that pt is ready for CT

## 2024-08-02 NOTE — ED Notes (Signed)
 EDP notified of WBC

## 2024-08-02 NOTE — ED Notes (Signed)
Attempted piv x2 unsuccessful.

## 2024-08-02 NOTE — ED Provider Notes (Signed)
 [place] EMERGENCY DEPARTMENT AT [facility] Provider Note   CSN: [id number] Arrival date & time: 08/01/24  2302     Patient presents with: constipated and Abdominal Pain   Jon Branch is a 23 y.o. male who presents to the emergency department with a chief complaint of left-sided abdominal pain.  Patient states that his last bowel movement was approximately 1 week ago.  He denies infectious symptoms like fever or chills.  He does appreciate some nausea but denies vomiting.  Denies urinary symptoms.  He states that he has been constipated before and had to go on a bowel regimen as well as change his diet.  Patient states that on a normal basis he has a bowel movement approximately once every 2 to 3 days.  Denies chest pain, shortness of breath.  He states that he he also has some lower back pain which she thinks is associated with the constipation as well.  Patient has had a limited diet over the past 1 to 2 days due to the feelings of nausea and fullness in his abdomen, he also states that he did not take any of his prescription medications today.  Past medical history significant for major depressive disorder, schizophreniform disorder, substance use, hypertension, GERD, etc.  An interpreter was offered for this encounter however patient declined.    Abdominal Pain      Prior to Admission medications  Medication Sig Start Date End Date Taking? Authorizing Provider  magnesium  citrate SOLN Take 296 mLs (1 Bottle total) by mouth once for 1 dose. 08/02/24 08/02/24 Yes Tailor Westfall F, PA-C  amLODipine  (NORVASC ) 5 MG tablet Take 5 mg by mouth daily. 03/27/23   [provider]  benztropine  (COGENTIN ) 0.5 MG tablet Take 1 tablet (0.5 mg total) by mouth 2 (two) times daily. For prevention of drug induced tremors 10/16/18   Collene Gouge I, NP  methocarbamol  (ROBAXIN ) 500 MG tablet Take 1 tablet (500 mg total) by mouth every 12 (twelve) hours as needed for muscle spasms. 07/26/24    Keith Sor, PA-C  naproxen  (NAPROSYN ) 500 MG tablet Take 1 tablet (500 mg total) by mouth every 12 (twelve) hours as needed for mild pain (pain score 1-3) or moderate pain (pain score 4-6). 07/26/24   Keith Sor, PA-C  ondansetron  (ZOFRAN ) 4 MG tablet Take 1 tablet (4 mg total) by mouth every 8 (eight) hours as needed for nausea or vomiting. 09/03/23   Remi Pippin, NP  Prenatal Vit-Fe Fumarate-FA (PRENATAL MULTIVITAMIN) TABS tablet Take 1 tablet by mouth daily at 12 noon. Vitamin supplement 10/16/18   Collene Gouge I, NP  risperiDONE  (RISPERDAL ) 3 MG tablet Take 1 tablet (3 mg total) by mouth 2 (two) times daily. For mood control Patient taking differently: Take 3 mg by mouth daily. 1/2 tablet For mood control 10/16/18   Collene Gouge I, NP    Allergies: Losartan and Porcine (pork) protein-containing drug products    Review of Systems  Gastrointestinal:  Positive for abdominal pain.    Updated Vital Signs BP 127/88 (BP Location: Right Arm)   Pulse 96   Temp 98.3 F (36.8 C) (Oral)   Resp 16   Ht 5' 11 (1.803 m)   Wt 98.9 kg   SpO2 100%   BMI 30.41 kg/m   Physical Exam Vitals and nursing note reviewed.  Constitutional:      General: He is awake. He is not in acute distress.    Appearance: Normal appearance. He is not ill-appearing, toxic-appearing  or diaphoretic.  HENT:     Head: Normocephalic and atraumatic.  Eyes:     General: No scleral icterus. Cardiovascular:     Rate and Rhythm: Normal rate and regular rhythm.  Pulmonary:     Effort: Pulmonary effort is normal. No respiratory distress.     Breath sounds: No wheezing, rhonchi or rales.  Abdominal:     General: Abdomen is flat.     Palpations: Abdomen is soft.     Tenderness: There is generalized abdominal tenderness (Generalized tenderness with palpation of left side of abdomen specifically left lower quadrant area). There is no right CVA tenderness, left CVA tenderness, guarding or rebound.  Musculoskeletal:         General: Normal range of motion.     Right lower leg: No edema.     Left lower leg: No edema.     Comments: Grossly normal range of motion of all 4 extremities  Skin:    General: Skin is warm.     Capillary Refill: Capillary refill takes less than 2 seconds.  Neurological:     General: No focal deficit present.     Mental Status: He is alert and oriented to person, place, and time.  Psychiatric:        Mood and Affect: Mood normal.        Behavior: Behavior normal. Behavior is cooperative.     (all labs ordered are listed, but only abnormal results are displayed) Labs Reviewed  COMPREHENSIVE METABOLIC PANEL WITH GFR - Abnormal; Notable for the following components:      Result Value   CO2 21 (*)    Total Bilirubin 1.3 (*)    Anion gap 19 (*)    All other components within normal limits  CBC - Abnormal; Notable for the following components:   WBC 17.4 (*)    All other components within normal limits  URINALYSIS, ROUTINE W REFLEX MICROSCOPIC - Abnormal; Notable for the following components:   Color, Urine AMBER (*)    APPearance HAZY (*)    Specific Gravity, Urine 1.036 (*)    Hgb urine dipstick SMALL (*)    Ketones, ur 80 (*)    Protein, ur 100 (*)    All other components within normal limits  LIPASE, BLOOD    EKG: None  Radiology: CT ABDOMEN PELVIS W CONTRAST Result Date: 08/02/2024 CLINICAL DATA:  Left-sided abdominal pain.  Leukocytosis. EXAM: CT ABDOMEN AND PELVIS WITH CONTRAST TECHNIQUE: Multidetector CT imaging of the abdomen and pelvis was performed using the standard protocol following bolus administration of intravenous contrast. RADIATION DOSE REDUCTION: This exam was performed according to the departmental dose-optimization program which includes automated exposure control, adjustment of the mA and/or kV according to patient size and/or use of iterative reconstruction technique. CONTRAST:  75mL OMNIPAQUE  IOHEXOL  350 MG/ML SOLN COMPARISON:  CT stone study  07/26/2024. FINDINGS: Lower chest: No acute findings. Hepatobiliary: No suspicious focal abnormality within the liver parenchyma. Small area of low attenuation in the anterior liver, adjacent to the falciform ligament, is in a characteristic location for focal fatty deposition/anomalous perfusion. No intrahepatic or extrahepatic biliary dilation. There is no evidence for gallstones, gallbladder wall thickening, or pericholecystic fluid. Pancreas: No focal mass lesion. No dilatation of the main duct. No intraparenchymal cyst. No peripancreatic edema. Spleen: No splenomegaly. No suspicious focal mass lesion. Adrenals/Urinary Tract: No adrenal nodule or mass. Kidneys unremarkable. No evidence for hydroureter. The urinary bladder appears normal for the degree of distention. Stomach/Bowel: Stomach is  unremarkable. No gastric wall thickening. No evidence of outlet obstruction. Duodenum is normally positioned as is the ligament of Treitz. No small bowel wall thickening. No small bowel dilatation. The terminal ileum is normal. The appendix is normal. No gross colonic mass. No colonic wall thickening. Vascular/Lymphatic: No abdominal aortic aneurysm. No abdominal aortic atherosclerotic calcification. There is no gastrohepatic or hepatoduodenal ligament lymphadenopathy. No retroperitoneal or mesenteric lymphadenopathy. No pelvic sidewall lymphadenopathy. Reproductive: The prostate gland and seminal vesicles are unremarkable. Other: No intraperitoneal free fluid. Musculoskeletal: Benign appearance sclerotic foci in the left femoral head are compatible with bone islands. No worrisome lytic or sclerotic osseous abnormality. IMPRESSION: No acute findings in the abdomen or pelvis. Specifically, no findings to explain the patient's history of left-sided abdominal pain. Electronically Signed   By: Camellia Candle M.D.   On: 08/02/2024 10:25     Procedures   Medications Ordered in the ED  sodium chloride  0.9 % bolus 1,000 mL (0  mLs Intravenous Stopped 08/02/24 1119)  iohexol  (OMNIPAQUE ) 350 MG/ML injection 75 mL (75 mLs Intravenous Contrast Given 08/02/24 0920)                                    Medical Decision Making Amount and/or Complexity of Data Reviewed Radiology: ordered.  Risk OTC drugs. Prescription drug management.   Patient presents to the ED for concern of nominal pain, nausea, this involves an extensive number of treatment options, and is a complaint that carries with it a high risk of complications and morbidity.  The differential diagnosis includes appendicitis, cholecystitis, small bowel obstruction, diverticulitis, constipation, pancreatitis, etc.   Co morbidities that complicate the patient evaluation  major depressive disorder, schizophreniform disorder, substance use, hypertension, GERD   Additional history obtained:  Reviewed most recent visit to the emergency department on 07/25/2024, at this time patient was seen for acute left-sided low back pain with left-sided sciatica, renal stone study was completed at this time which showed no acute intra-abdominal or pelvic abnormality   Lab Tests:  I Ordered, and personally interpreted labs.  The pertinent results include: CBC with leukocytosis of 17.4, CMP shows mildly decreased bicarb at 21, mildly elevated total bilirubin at 1.3, anion gap of 19, urinalysis not consistent with infection but does have small hemoglobin, ketones, as well as protein   Imaging Studies ordered:  I ordered imaging studies including CT abdomen pelvis with contrast I independently visualized and interpreted imaging which showed no acute findings in the abdomen or pelvis I agree with the radiologist interpretation   Medicines ordered and prescription drug management:  I ordered medication including fluids for dehydration Reevaluation of the patient after these medicines showed that the patient improved I have reviewed the patients home medicines and have  made adjustments as needed   Test Considered:  None   Critical Interventions:  None   Problem List / ED Course:  23 year old male, vital signs stable, presents to the emergency department with a chief complaint of abdominal pain, states that his last bowel movement was approximately 1 week ago On physical exam patient does have left-sided abdominal pain and is visibly in pain with palpation, abdomen is soft however Denies infectious symptoms like fever or chills CBC notable for leukocytosis of 17.4, CMP shows slightly decreased bicarb as well as slightly increased anion gap, lipase unremarkable, urinalysis shows small hemoglobin, 80 ketones, 100 protein, not consistent with infection Due to abdominal tenderness on  examination as well as leukocytosis will order CT abdomen pelvis with contrast to further evaluate, will also rehydrate patient as he appears dry CT reassuring No evidence of acute life-threatening process during today's emergency department evaluation, labs and imaging are reassuring, patient rehydrated appropriately At this time I feel patient is stable for discharge, most likely diagnosis at this time is constipation, patient sent home with instructions for bowel regimen and instructions for follow-up with primary care provider Return precautions given Patient discharged   Reevaluation:  After the interventions noted above, I reevaluated the patient and found that they have :improved   Social Determinants of Health:  Not native english speaker   Dispostion:  After consideration of the diagnostic results and the patients response to treatment, I feel that the patent would benefit from discharge and outpatient therapy as described, follow-up with primary care provider.     Final diagnoses:  Abdominal pain, unspecified abdominal location  Constipation, unspecified constipation type    ED Discharge Orders          Ordered    magnesium  citrate SOLN   Once         08/02/24 [address] PA-C 08/02/24 1703    Freddi Hamilton, MD 08/03/24 [phone]

## 2024-08-02 NOTE — Discharge Instructions (Addendum)
 It was a pleasure taking care of you today.  Based on your history, physical exam, labs, as well as imaging I feel you are safe for discharge.  Today the CT scan of your abdomen was reassuring and there was no evidence of obstruction.  To help with your constipation I have sent in a prescription for magnesium  citrate, please take as prescribed until you have a bowel movement.  I do recommend that you follow-up with your primary care provider and you may need to go on a daily probiotic to help prevent constipation in the future since this seems to be a reoccurring problem.  If you experience any the following symptoms including but not limited to fever, chills, severe abdominal pain, excessive nausea/vomiting, urinary symptoms, or other concerning symptom please return to the emergency department or seek further medical evaluation.  Please make sure that you are staying hydrated as this will also help with constipation.  Recommend follow-up with your primary care provider within the next 1 to 2 weeks, sooner if symptoms warrant.  If symptoms worsen recommend follow-up within 48 hours.   If this problem persists you could also consider over-the-counter MiraLAX but do not take magnesium  citrate and MiraLAX together. Please make sure you are getting the proper amount of fiber in your diet as well.

## 2024-08-02 NOTE — ED Notes (Signed)
 Patient transported to CT

## 2024-08-03 ENCOUNTER — Emergency Department (HOSPITAL_COMMUNITY)
Admission: EM | Admit: 2024-08-03 | Discharge: 2024-08-04 | Disposition: A | Payer: MEDICAID | Attending: Emergency Medicine | Admitting: Emergency Medicine

## 2024-08-03 ENCOUNTER — Other Ambulatory Visit: Payer: Self-pay

## 2024-08-03 DIAGNOSIS — I10 Essential (primary) hypertension: Secondary | ICD-10-CM | POA: Diagnosis not present

## 2024-08-03 DIAGNOSIS — F329 Major depressive disorder, single episode, unspecified: Secondary | ICD-10-CM | POA: Diagnosis present

## 2024-08-03 DIAGNOSIS — D72829 Elevated white blood cell count, unspecified: Secondary | ICD-10-CM | POA: Diagnosis not present

## 2024-08-03 DIAGNOSIS — F32A Depression, unspecified: Secondary | ICD-10-CM

## 2024-08-03 DIAGNOSIS — Z79899 Other long term (current) drug therapy: Secondary | ICD-10-CM | POA: Diagnosis not present

## 2024-08-03 LAB — CBC WITH DIFFERENTIAL/PLATELET
Abs Immature Granulocytes: 0.06 K/uL (ref 0.00–0.07)
Basophils Absolute: 0.1 K/uL (ref 0.0–0.1)
Basophils Relative: 1 %
Eosinophils Absolute: 0.3 K/uL (ref 0.0–0.5)
Eosinophils Relative: 2 %
HCT: 46 % (ref 39.0–52.0)
Hemoglobin: 14.4 g/dL (ref 13.0–17.0)
Immature Granulocytes: 0 %
Lymphocytes Relative: 11 %
Lymphs Abs: 1.6 K/uL (ref 0.7–4.0)
MCH: 27.7 pg (ref 26.0–34.0)
MCHC: 31.3 g/dL (ref 30.0–36.0)
MCV: 88.6 fL (ref 80.0–100.0)
Monocytes Absolute: 0.6 K/uL (ref 0.1–1.0)
Monocytes Relative: 4 %
Neutro Abs: 12.3 K/uL — ABNORMAL HIGH (ref 1.7–7.7)
Neutrophils Relative %: 82 %
Platelets: 379 K/uL (ref 150–400)
RBC: 5.19 MIL/uL (ref 4.22–5.81)
RDW: 12.5 % (ref 11.5–15.5)
WBC: 14.9 K/uL — ABNORMAL HIGH (ref 4.0–10.5)
nRBC: 0 % (ref 0.0–0.2)

## 2024-08-03 LAB — URINE DRUG SCREEN
Amphetamines: NEGATIVE
Barbiturates: NEGATIVE
Benzodiazepines: NEGATIVE
Cocaine: NEGATIVE
Fentanyl: NEGATIVE
Methadone Scn, Ur: NEGATIVE
Opiates: NEGATIVE
Tetrahydrocannabinol: NEGATIVE

## 2024-08-03 LAB — ETHANOL: Alcohol, Ethyl (B): <15 mg/dL (ref <15)

## 2024-08-03 NOTE — ED Notes (Signed)
 Patient resting comfortably with eyes closed. No signs of distress noted.

## 2024-08-03 NOTE — ED Notes (Signed)
 Patient is resting in room with eyes closed. No signs of distress noted. Respirations are even and unlabored.

## 2024-08-03 NOTE — ED Notes (Signed)
Patient resting in bed.  No signs of distress noted

## 2024-08-03 NOTE — ED Notes (Signed)
Patient resting with eyes closed.   No distress noted.

## 2024-08-03 NOTE — ED Notes (Signed)
Patient resting comfortably in bed.  No signs of distress noted.

## 2024-08-03 NOTE — ED Notes (Signed)
 Patient is resting with eyes closed. No signs of distress noted. Respirations even.

## 2024-08-03 NOTE — ED Notes (Addendum)
 Patient is sitting on the side of bed eating. No signs of distress noted

## 2024-08-03 NOTE — ED Notes (Signed)
 Patient is resting comfortably with eyes closed. No signs of distress noted. Respirations even.

## 2024-08-03 NOTE — BH Assessment (Signed)
 This patient has been referred to [facility] for [facility] assessment. They will be contacting the RN and staff primarily through the EPIC chat to communicate about the assessment, placement of the telecart and other communications.

## 2024-08-03 NOTE — ED Triage Notes (Signed)
 PT arrives as an IVC by [employer]. Paperwork states Respondent has been diagnosed with depression. He hasn't eaten in 2 weeks. Respondent also refuses to take his medication because he believes its bad for him. He isn't tending to personal hygiene(he hasn't showered in over a week). Respondent speaks in a word salad. Nothing he says makes sense.   Pt alert, with no complaints upon arrival. Pt appears to be in no distress. PT reports that he is eating without issue. Pt states that he does not know how/why he is here.

## 2024-08-03 NOTE — ED Provider Notes (Signed)
 [place] EMERGENCY DEPARTMENT AT [facility] Provider Note   CSN: [id number] Arrival date & time: 08/03/24  1253     Patient presents with: Psychiatric Evaluation   Jon Branch is a 23 y.o. male.   Pt is a 23y/o male with hx of major depressive disorder, schizophreniform disorder, substance use, hypertension, GERD who is presenting today with police under IVC.  Patient does complain that he has leg pain on the left side intermittently this week.  He reports it does hurt to walk on it but denies any injuries.  He was seen in the emergency room a day and a half ago for constipation and abdominal pain.  At that time he was found to have a leukocytosis but a negative CT and otherwise unremarkable lab tests.  Patient reports he is not sure why he is here today.  He does state that he stopped all of his medications 3 or 4 days ago because he was scared to take them and thought they were bad for him.  Based on his IVC paperwork family reports that he has not eaten in 2 weeks he has been refusing to take his medication because he believes it is bad and he is not tending to his personal hygiene.  They report he has not showered in over a week and he is speaking nonsense.  Patient denies any drug or alcohol use.  He denies a headache.  He denies any abdominal pain at this time and denies any nausea or vomiting.  Police report that they witnessed patient drinking water  when they arrived.  He has been cooperative with them  The history is provided by the patient.       Prior to Admission medications  Medication Sig Start Date End Date Taking? Authorizing Provider  amLODipine  (NORVASC ) 5 MG tablet Take 5 mg by mouth daily. 03/27/23   [provider]  benztropine  (COGENTIN ) 0.5 MG tablet Take 1 tablet (0.5 mg total) by mouth 2 (two) times daily. For prevention of drug induced tremors 10/16/18   Collene Gouge I, NP  methocarbamol  (ROBAXIN ) 500 MG tablet Take 1 tablet (500 mg total) by  mouth every 12 (twelve) hours as needed for muscle spasms. 07/26/24   Keith Sor, PA-C  naproxen  (NAPROSYN ) 500 MG tablet Take 1 tablet (500 mg total) by mouth every 12 (twelve) hours as needed for mild pain (pain score 1-3) or moderate pain (pain score 4-6). 07/26/24   Keith Sor, PA-C  ondansetron  (ZOFRAN ) 4 MG tablet Take 1 tablet (4 mg total) by mouth every 8 (eight) hours as needed for nausea or vomiting. 09/03/23   Remi Pippin, NP  Prenatal Vit-Fe Fumarate-FA (PRENATAL MULTIVITAMIN) TABS tablet Take 1 tablet by mouth daily at 12 noon. Vitamin supplement 10/16/18   Collene Gouge I, NP  risperiDONE  (RISPERDAL ) 3 MG tablet Take 1 tablet (3 mg total) by mouth 2 (two) times daily. For mood control Patient taking differently: Take 3 mg by mouth daily. 1/2 tablet For mood control 10/16/18   Collene Gouge I, NP    Allergies: Losartan and Porcine (pork) protein-containing drug products    Review of Systems  Updated Vital Signs BP (!) 136/98 (BP Location: Right Arm)   Pulse (!) 104   Temp 98.2 F (36.8 C) (Oral)   Resp 16   SpO2 98%   Physical Exam Vitals and nursing note reviewed.  Constitutional:      General: He is not in acute distress.    Appearance: He is  well-developed.  HENT:     Head: Normocephalic and atraumatic.  Eyes:     Conjunctiva/sclera: Conjunctivae normal.     Pupils: Pupils are equal, round, and reactive to light.  Cardiovascular:     Rate and Rhythm: Regular rhythm. Tachycardia present.     Heart sounds: No murmur heard. Pulmonary:     Effort: Pulmonary effort is normal. No respiratory distress.     Breath sounds: Normal breath sounds. No wheezing or rales.  Abdominal:     General: There is no distension.     Palpations: Abdomen is soft.     Tenderness: There is no abdominal tenderness. There is no guarding or rebound.  Musculoskeletal:        General: No tenderness. Normal range of motion.     Cervical back: Normal range of motion and neck supple.  Skin:     General: Skin is warm and dry.     Findings: No erythema or rash.  Neurological:     Mental Status: He is alert and oriented to person, place, and time.  Psychiatric:        Behavior: Behavior normal.     Comments: Slightly withdrawn but answering all questions.  Cooperative     (all labs ordered are listed, but only abnormal results are displayed) Labs Reviewed  CBC WITH DIFFERENTIAL/PLATELET - Abnormal; Notable for the following components:      Result Value   WBC 14.9 (*)    Neutro Abs 12.3 (*)    All other components within normal limits  URINE DRUG SCREEN  ETHANOL    EKG: None  Radiology: CT ABDOMEN PELVIS W CONTRAST Result Date: 08/02/2024 CLINICAL DATA:  Left-sided abdominal pain.  Leukocytosis. EXAM: CT ABDOMEN AND PELVIS WITH CONTRAST TECHNIQUE: Multidetector CT imaging of the abdomen and pelvis was performed using the standard protocol following bolus administration of intravenous contrast. RADIATION DOSE REDUCTION: This exam was performed according to the departmental dose-optimization program which includes automated exposure control, adjustment of the mA and/or kV according to patient size and/or use of iterative reconstruction technique. CONTRAST:  75mL OMNIPAQUE  IOHEXOL  350 MG/ML SOLN COMPARISON:  CT stone study 07/26/2024. FINDINGS: Lower chest: No acute findings. Hepatobiliary: No suspicious focal abnormality within the liver parenchyma. Small area of low attenuation in the anterior liver, adjacent to the falciform ligament, is in a characteristic location for focal fatty deposition/anomalous perfusion. No intrahepatic or extrahepatic biliary dilation. There is no evidence for gallstones, gallbladder wall thickening, or pericholecystic fluid. Pancreas: No focal mass lesion. No dilatation of the main duct. No intraparenchymal cyst. No peripancreatic edema. Spleen: No splenomegaly. No suspicious focal mass lesion. Adrenals/Urinary Tract: No adrenal nodule or mass. Kidneys  unremarkable. No evidence for hydroureter. The urinary bladder appears normal for the degree of distention. Stomach/Bowel: Stomach is unremarkable. No gastric wall thickening. No evidence of outlet obstruction. Duodenum is normally positioned as is the ligament of Treitz. No small bowel wall thickening. No small bowel dilatation. The terminal ileum is normal. The appendix is normal. No gross colonic mass. No colonic wall thickening. Vascular/Lymphatic: No abdominal aortic aneurysm. No abdominal aortic atherosclerotic calcification. There is no gastrohepatic or hepatoduodenal ligament lymphadenopathy. No retroperitoneal or mesenteric lymphadenopathy. No pelvic sidewall lymphadenopathy. Reproductive: The prostate gland and seminal vesicles are unremarkable. Other: No intraperitoneal free fluid. Musculoskeletal: Benign appearance sclerotic foci in the left femoral head are compatible with bone islands. No worrisome lytic or sclerotic osseous abnormality. IMPRESSION: No acute findings in the abdomen or pelvis. Specifically, no findings  to explain the patient's history of left-sided abdominal pain. Electronically Signed   By: Camellia Candle M.D.   On: 08/02/2024 10:25     Procedures   Medications Ordered in the ED - No data to display                                  Medical Decision Making Amount and/or Complexity of Data Reviewed Labs: ordered. Decision-making details documented in ED Course.   Patient presenting today under IVC.  Family reports patient is not caring for himself, speaking gibberish and not taking his medication.  Currently here patient is cooperative.  Found to be minimally tachycardic.  Patient was evaluated on 08/01/2024 and at that time was found to have a leukocytosis but CT of the abdomen was normal.  His CMP was also normal.  Will get UDS, alcohol level and repeat CBC.  Patient denies any abdominal pain at this time. I dependently interpreted patient's labs.  CBC with improving  white count.  On exam patient does not have any signs of abnormality with his leg.  Will have psychiatry evaluate.  At this time patient is medically clear.     Final diagnoses:  Depression, unspecified depression type    ED Discharge Orders     None          Doretha Folks, MD 08/03/24 [phone]

## 2024-08-03 NOTE — ED Notes (Signed)
 Patient resting comfortably with eyes closed. No signs of distress noted. Respirations even and unlabored

## 2024-08-03 NOTE — ED Notes (Signed)
 Patient is resting in bed. No signs of distress noted. Respirations even and unlabored.

## 2024-08-03 NOTE — ED Notes (Signed)
 Patient resting comfortably in bed with eyes closed. No signs of distress noted.

## 2024-08-03 NOTE — ED Notes (Signed)
 Patient observed resting in bed. No signs of distress noted.

## 2024-08-04 ENCOUNTER — Inpatient Hospital Stay (HOSPITAL_COMMUNITY)
Admission: AD | Admit: 2024-08-04 | Discharge: 2024-08-09 | DRG: 885 | Disposition: A | Discharge status: Home / Self Care | Payer: MEDICAID | Source: Intra-hospital | Attending: Student in an Organized Health Care Education/Training Program | Admitting: Student in an Organized Health Care Education/Training Program

## 2024-08-04 ENCOUNTER — Other Ambulatory Visit: Payer: Self-pay

## 2024-08-04 ENCOUNTER — Encounter (HOSPITAL_COMMUNITY): Payer: Self-pay | Admitting: Psychiatry

## 2024-08-04 DIAGNOSIS — K219 Gastro-esophageal reflux disease without esophagitis: Secondary | ICD-10-CM | POA: Diagnosis present

## 2024-08-04 DIAGNOSIS — Z91128 Patient's intentional underdosing of medication regimen for other reason: Secondary | ICD-10-CM

## 2024-08-04 DIAGNOSIS — F419 Anxiety disorder, unspecified: Secondary | ICD-10-CM | POA: Diagnosis present

## 2024-08-04 DIAGNOSIS — Z8249 Family history of ischemic heart disease and other diseases of the circulatory system: Secondary | ICD-10-CM

## 2024-08-04 DIAGNOSIS — I10 Essential (primary) hypertension: Secondary | ICD-10-CM | POA: Diagnosis present

## 2024-08-04 DIAGNOSIS — F333 Major depressive disorder, recurrent, severe with psychotic symptoms: Secondary | ICD-10-CM | POA: Diagnosis present

## 2024-08-04 DIAGNOSIS — F329 Major depressive disorder, single episode, unspecified: Principal | ICD-10-CM | POA: Diagnosis present

## 2024-08-04 DIAGNOSIS — Z91148 Patient's other noncompliance with medication regimen for other reason: Secondary | ICD-10-CM | POA: Diagnosis not present

## 2024-08-04 DIAGNOSIS — Z888 Allergy status to other drugs, medicaments and biological substances status: Secondary | ICD-10-CM | POA: Diagnosis not present

## 2024-08-04 DIAGNOSIS — Z833 Family history of diabetes mellitus: Secondary | ICD-10-CM | POA: Diagnosis not present

## 2024-08-04 DIAGNOSIS — Z87891 Personal history of nicotine dependence: Secondary | ICD-10-CM

## 2024-08-04 DIAGNOSIS — Z91014 Allergy to mammalian meats: Secondary | ICD-10-CM | POA: Diagnosis not present

## 2024-08-04 MED ORDER — HALOPERIDOL LACTATE 5 MG/ML IJ SOLN: 5.0000 mg | Freq: Four times a day (QID) | INTRAMUSCULAR | Status: DC | PRN

## 2024-08-04 MED ORDER — MAGNESIUM HYDROXIDE 400 MG/5ML PO SUSP
30.0000 mL | Freq: Every day | ORAL | Status: DC | PRN
  Filled 2024-08-04: qty 30

## 2024-08-04 MED ORDER — AMLODIPINE BESYLATE 5 MG PO TABS
5.0000 mg | ORAL_TABLET | Freq: Every day | ORAL | Status: DC
  Administered 2024-08-04: 12:00:00 5 mg via ORAL
  Filled 2024-08-04: qty 1

## 2024-08-04 MED ORDER — LORAZEPAM 2 MG/ML IJ SOLN: 2.0000 mg | Freq: Three times a day (TID) | INTRAMUSCULAR | Status: DC | PRN

## 2024-08-04 MED ORDER — HALOPERIDOL LACTATE 5 MG/ML IJ SOLN: 10.0000 mg | Freq: Three times a day (TID) | INTRAMUSCULAR | Status: DC | PRN

## 2024-08-04 MED ORDER — HYDROXYZINE HCL 25 MG PO TABS
25.0000 mg | ORAL_TABLET | Freq: Three times a day (TID) | ORAL | Status: DC | PRN
  Filled 2024-08-04 (×2): qty 1

## 2024-08-04 MED ORDER — BENZTROPINE MESYLATE 0.5 MG PO TABS
0.5000 mg | ORAL_TABLET | Freq: Two times a day (BID) | ORAL | Status: DC
  Administered 2024-08-04: 11:00:00 0.5 mg via ORAL
  Filled 2024-08-04: qty 1

## 2024-08-04 MED ORDER — DIPHENHYDRAMINE HCL 50 MG/ML IJ SOLN: 50.0000 mg | Freq: Three times a day (TID) | INTRAMUSCULAR | Status: DC | PRN

## 2024-08-04 MED ORDER — RISPERIDONE 1 MG PO TABS
1.5000 mg | ORAL_TABLET | Freq: Two times a day (BID) | ORAL | Status: DC
  Administered 2024-08-04 – 2024-08-09 (×10): 1.5 mg via ORAL
  Filled 2024-08-04 (×10): qty 1

## 2024-08-04 MED ORDER — DIPHENHYDRAMINE HCL 25 MG PO CAPS: 50.0000 mg | ORAL_CAPSULE | Freq: Three times a day (TID) | ORAL | Status: DC | PRN

## 2024-08-04 MED ORDER — HYDROXYZINE HCL 25 MG PO TABS: 25.0000 mg | ORAL_TABLET | Freq: Three times a day (TID) | ORAL | Status: DC | PRN

## 2024-08-04 MED ORDER — BENZTROPINE MESYLATE 0.5 MG PO TABS
0.5000 mg | ORAL_TABLET | Freq: Two times a day (BID) | ORAL | Status: DC
  Administered 2024-08-04 – 2024-08-09 (×10): 0.5 mg via ORAL
  Filled 2024-08-04 (×10): qty 1

## 2024-08-04 MED ORDER — HALOPERIDOL LACTATE 5 MG/ML IJ SOLN: 5.0000 mg | Freq: Three times a day (TID) | INTRAMUSCULAR | Status: DC | PRN

## 2024-08-04 MED ORDER — HALOPERIDOL 5 MG PO TABS: 5.0000 mg | ORAL_TABLET | Freq: Three times a day (TID) | ORAL | Status: DC | PRN

## 2024-08-04 MED ORDER — ACETAMINOPHEN 325 MG PO TABS: 650.0000 mg | ORAL_TABLET | Freq: Four times a day (QID) | ORAL | Status: DC | PRN

## 2024-08-04 MED ORDER — ALUM & MAG HYDROXIDE-SIMETH 200-200-20 MG/5ML PO SUSP
30.0000 mL | ORAL | Status: DC | PRN
  Administered 2024-08-06: 21:00:00 30 mL via ORAL
  Filled 2024-08-04: qty 30

## 2024-08-04 MED ORDER — RISPERIDONE 1 MG PO TABS
1.5000 mg | ORAL_TABLET | Freq: Two times a day (BID) | ORAL | Status: DC
  Administered 2024-08-04: 11:00:00 1.5 mg via ORAL
  Filled 2024-08-04: qty 1

## 2024-08-04 MED ORDER — LORAZEPAM 2 MG/ML IJ SOLN: 2.0000 mg | Freq: Four times a day (QID) | INTRAMUSCULAR | Status: DC | PRN

## 2024-08-04 NOTE — ED Notes (Signed)
 Patient took medication without a problem

## 2024-08-04 NOTE — Tx Team (Signed)
 Initial Treatment Plan 08/04/2024 2:56 PM Abiel Hildegard FMW:[id number]    PATIENT STRESSORS: Medication change or noncompliance     PATIENT STRENGTHS: Supportive family/friends    PATIENT IDENTIFIED PROBLEMS: schizophreniform disorder   MDD                   DISCHARGE CRITERIA:  Improved stabilization in mood, thinking, and/or behavior Verbal commitment to aftercare and medication compliance  PRELIMINARY DISCHARGE PLAN: Return to previous living arrangement  PATIENT/FAMILY INVOLVEMENT: This treatment plan has been presented to and reviewed with the patient, Nels Walen..  The patient has been given the opportunity to ask questions and make suggestions.  Uri Covey Byrd, RN 08/04/2024, 2:56 PM

## 2024-08-04 NOTE — ED Notes (Signed)
 Patient is resting comfortably. No signs of distress noted. Respirations even and unlabored

## 2024-08-04 NOTE — ED Notes (Signed)
Patient is resting comfortably. No signs of distress noted.

## 2024-08-04 NOTE — Progress Notes (Signed)
 Jon Branch admitted involuntarily from [facility].  Per IVC: Respondent has been diagnosed with depression. He hasn't eaten in 2 weeks. Respondent also refuses to take his medication because he believes its bad for him. He isn't tending to personal hygiene, he hasn't showered in over a week. Respondent speaks in a word salad. Nothing he says makes sense.    Per patient: Patient presents with flat affect but cooperative. Patient appears preoccupied at times with delayed response. When asked what brought him here patient responded, I came into the hospital because I was sick then they brought me here, I am not sure why Im here. Patient currently denies SI,HI, and visual hallucinations. Patient states he sometimes hears voices but is not able to elaborate on what they say. Patient states he lives with his parents and is unemployed. Patient plans to return home with his parents upon discharging. Patient oriented to unit/unit rules. Patient verbalized all understanding. Patient states he does not smoke or drink. UDS negative. Patient noted to have elevated BP and was given BP medication at the ED before arriving. Patient denies any current pain or discomfort. Patient remains safe on unit with q15 min safety checks in place.   BP (!) 141/94 (BP Location: Left Arm)   Pulse (!) 105   Temp (!) 97.5 F (36.4 C) (Oral)   Resp 16   Ht 5' 11 (1.803 m)   Wt 87.1 kg   SpO2 98%   BMI 26.78 kg/m

## 2024-08-04 NOTE — Group Note (Signed)
 Date:  08/04/2024 Time:  4:42 PM  Group Topic/Focus: Ice Breaker A simple and effective icebreaker for adult group therapy is the [employer], where each participant shares one word that describes how they are feeling at the start of the session. This activity creates a low-pressure opportunity for self-expression, helps normalize a range of emotions, and encourages emotional awareness without requiring participants to disclose more than they are comfortable sharing. It also allows the facilitator to quickly gauge the groups emotional state and sets a supportive, respectful tone for the session, making it especially suitable for mental health groups involving anxiety, depression, or grief.    Participation Level:  Active  Participation Quality:  Appropriate  Affect:  Appropriate  Cognitive:  Appropriate  Insight: Appropriate  Engagement in Group:  Engaged  Modes of Intervention:  Discussion  Additional Comments:    Jon Branch 08/04/2024, 4:42 PM

## 2024-08-04 NOTE — Progress Notes (Signed)
 Pt has been accepted to [facility] on 08/04/2024 . Bed assignment:403-1  Pt meets inpatient criteria per [name], NP   Attending Physician will be Dr. Raliegh    Report can be called to: - Adult unit: [id number]  Pt can arrive pending labs   Care Team Notified: [facility] Cherylynn Ernst, RN, Sharlett Baseman, Paramedic, Cathaleen DELENA Adam, NP

## 2024-08-04 NOTE — Group Note (Signed)
 Date:  08/04/2024 Time:  4:02 PM  Group Topic/Focus: social work group on grief Social work with grieving adults in mental health settings focuses on supporting individuals as they cope with loss while addressing the emotional, psychological, and social impacts of grief. Social workers assess the nature of the loss, the clients coping abilities, support systems, and any co-occurring mental health concerns such as depression or anxiety. Using a strengths-based and culturally sensitive approach, they provide emotional support, normalize grief reactions, offer psychoeducation, and apply therapeutic interventions to help clients process their feelings and adjust to life changes. The goal is not to eliminate grief, but to promote healthy adaptation, resilience, and improved mental well-being.    Participation Level:  Did Not Attend    Jon Branch 08/04/2024, 4:02 PM

## 2024-08-04 NOTE — ED Notes (Addendum)
Patient is resting comfortably. No signs of distress noted.

## 2024-08-04 NOTE — BH Assessment (Signed)
 Comprehensive Clinical Assessment (CCA) Note  08/04/2024 Jon Branch [id number]  Chief Complaint:  Chief Complaint  Patient presents with   Psychiatric Evaluation  Disposition: Per [name], NP patient is recommended for inpatient admission. Disposition SW to pursue appropriate inpatient options.  The patient demonstrates the following risk factors for suicide: Chronic risk factors for suicide include: psychiatric disorder of Schizophreniform disorder, MDD, substance induced mood disorder. Acute risk factors for suicide include: N/A. Protective factors for this patient include: hope for the future. Considering these factors, the overall suicide risk at this point appears to be low. Patient is not appropriate for outpatient follow up.  Per IVC Respondent has been diagnosed with depression. He hasn't eaten in 2 weeks. Respondent also refuses to take his medication because he believes its bad for him. He isn't tending to personal hygiene, he hasn't showered in over a week. Respondent speaks in a word salad. Nothing he says makes sense.  Per EDP note Pt is a 23y/o male with hx of major depressive disorder, schizophreniform disorder, substance use, hypertension, GERD who is presenting today with police under IVC.  Patient does complain that he has leg pain on the left side intermittently this week.  He reports it does hurt to walk on it but denies any injuries.  He was seen in the emergency room a day and a half ago for constipation and abdominal pain.  At that time he was found to have a leukocytosis but a negative CT and otherwise unremarkable lab tests.  Patient reports he is not sure why he is here today.  He does state that he stopped all of his medications 3 or 4 days ago because he was scared to take them and thought they were bad for him.  Based on his IVC paperwork family reports that he has not eaten in 2 weeks he has been refusing to take his medication because he believes it is bad and  he is not tending to his personal hygiene.  They report he has not showered in over a week and he is speaking nonsense.  Patient denies any drug or alcohol use.  He denies a headache.  He denies any abdominal pain at this time and denies any nausea or vomiting.  Police report that they witnessed patient drinking water  when they arrived.  He has been cooperative with them    [name] reports he lives with his parents and confirms the information in the IVC that he has not been taking his medications and has not been eating. He reports his medications don't make him feel good. He states he hasn't been eating because he feels like he shouldn't eat as much because its not healthy. During the assessment the patient is observed laughing inappropriately and at times doesn't make sense. He answers questions with random statements or words. He states he had a provider for medication management but does not remember who they are. He denies SI/HI, NSSIB and AVH at this time.He states he stopped taking his medications a few days ago. He denies history of substance abuse. He denies any depressive symptoms outside of his poor hygiene and decreased appetite.   Patient denies any current stressors. He denies history of abuse or trauma. He denies access to weapons. He denies current legal concerns. He states he is not working at this time. Unable to assess further. Clinician explained to the patient that he is under IVC and explained what this meant. He verbalized his understanding.  Visit Diagnosis:  Altered mental state Major Depressive disorder   CCA Screening, Triage and Referral (STR)  Patient Reported Information How did you hear about us ? Legal System  What Is the Reason for Your Visit/Call Today? Per IVC Respondent has been diagnosed with depression. He hasn't eaten in 2 weeks. Respondent also refuses to take his medication because he believes its bad for him. He isn't tending to personal hygiene,  he hasn't showered in over a week. Respondent speaks in a word salad. Nothing he says makes sense.  How Long Has This Been Causing You Problems? 1 wk - 1 month  What Do You Feel Would Help You the Most Today? Treatment for Depression or other mood problem; Medication(s)   Have You Recently Had Any Thoughts About Hurting Yourself? No  Are You Planning to Commit Suicide/Harm Yourself At This time? No   Flowsheet Row ED from 08/03/2024 in [facility] Emergency Department at [facility] ED from 08/01/2024 in [facility] Department at [facility] ED from 07/25/2024 in [facility] Emergency Department at [facility]  C-SSRS RISK CATEGORY No Risk No Risk No Risk    Have you Recently Had Thoughts About Hurting Someone Sher[place]? No  Are You Planning to Harm Someone at This Time? No  Explanation: pt denies   Have You Used Any Alcohol or Drugs in the Past 24 Hours? No  How Long Ago Did You Use Drugs or Alcohol? N/a What Did You Use and How Much? N/a  Do You Currently Have a Therapist/Psychiatrist? No  Name of Therapist/Psychiatrist:    Have You Been Recently Discharged From Any Office Practice or Programs? No  Explanation of Discharge From Practice/Program: n/a    CCA Screening Triage Referral Assessment Type of Contact: Tele-Assessment  Telemedicine Service Delivery: Telemedicine service delivery: This service was provided via telemedicine using a 2-way, interactive audio and video technology  Is this Initial or Reassessment? Is this Initial or Reassessment?: Initial Assessment  Date Telepsych consult ordered in [facility]:  Date Telepsych consult ordered in [facility]: 08/03/24  Time Telepsych consult ordered in [facility]:  Time Telepsych consult ordered in [facility]: [id number]  Location of Assessment: [facility] ED  Provider Location: [facility] Assessment Services   Collateral Involvement: IVC paperwork   Does Patient Have a Automotive Engineer Guardian? No  Legal Guardian  Contact Information: n/a  Copy of Legal Guardianship Form: -- (n/a)  Legal Guardian Notified of Arrival: -- (n/a)  Legal Guardian Notified of Pending Discharge: -- (n/a)  If Minor and Not Living with Parent(s), Who has Custody? n/a  Is CPS involved or ever been involved? Never  Is APS involved or ever been involved? Never   Patient Determined To Be At Risk for Harm To Self or Others Based on Review of Patient Reported Information or Presenting Complaint? No  Method: No Plan  Availability of Means: No access or NA  Intent: Vague intent or NA  Notification Required: No need or identified person  Additional Information for Danger to Others Potential: -- (n/a)  Additional Comments for Danger to Others Potential: n/a  Are There Guns or Other Weapons in Your Home? No  Types of Guns/Weapons: n/a  Are These Weapons Safely Secured?                            -- (n/a)  Who Could Verify You Are Able To Have These Secured: n/a  Do You Have any  Outstanding Charges, Pending Court Dates, Parole/Probation? Pt denies  Contacted To Inform of Risk of Harm To Self or Others: Law Enforcement    Does Patient Present under Involuntary Commitment? Yes    [place] of Residence: [place]   Patient Currently Receiving the Following Services: Not Receiving Services   Determination of Need: Urgent (48 hours)   Options For Referral: Inpatient Hospitalization     CCA Biopsychosocial Patient Reported Schizophrenia/Schizoaffective Diagnosis in Past: Yes   Strengths: Cooperation in assessment   Mental Health Symptoms Depression:  Change in energy/activity   Duration of Depressive symptoms: Duration of Depressive Symptoms: N/A   Mania:  N/A   Anxiety:   N/A   Psychosis:  Grossly disorganized speech   Duration of Psychotic symptoms: Duration of Psychotic Symptoms: N/A   Trauma:  N/A   Obsessions:  N/A   Compulsions:  N/A   Inattention:  N/A    Hyperactivity/Impulsivity:  N/A   Oppositional/Defiant Behaviors:  None; N/A   Emotional Irregularity:  N/A   Other Mood/Personality Symptoms:  n/a    Mental Status Exam Appearance and self-care  Stature:  Average   Weight:  Average weight   Clothing:  -- (scrubs)   Grooming:  Neglected   Cosmetic use:  None   Posture/gait:  Normal   Motor activity:  Not Remarkable   Sensorium  Attention:  Confused   Concentration:  Anxiety interferes   Orientation:  Person; Place; Situation   Recall/memory:  Defective in Remote   Affect and Mood  Affect:  Anxious   Mood:  Euthymic   Relating  Eye contact:  Fleeting   Facial expression:  Responsive   Attitude toward examiner:  Cooperative   Thought and Language  Speech flow: Flight of Ideas   Thought content:  Appropriate to Mood and Circumstances   Preoccupation:  None   Hallucinations:  None   Organization:  Disorganized   Company Secretary of Knowledge:  Average   Intelligence:  Average   Abstraction:  Normal   Judgement:  Impaired   Reality Testing:  Distorted   Insight:  Fair   Decision Making:  -- (n/a)   Social Functioning  Social Maturity:  -- (n/a)   Social Judgement:  -- (n/a)   Stress  Stressors:  Other (Comment) (none reported)   Coping Ability:  Overwhelmed   Skill Deficits:  Communication   Supports:  Family     Religion: Religion/Spirituality Are You A Religious Person?: No How Might This Affect Treatment?: n/a  Leisure/Recreation: Leisure / Recreation Do You Have Hobbies?:  ginette)  Exercise/Diet: Exercise/Diet Do You Exercise?:  (uta) Have You Gained or Lost A Significant Amount of Weight in the Past Six Months?:  (uta) Do You Follow a Special Diet?:  (uta) Do You Have Any Trouble Sleeping?:  (uta)   CCA Employment/Education Employment/Work Situation: Employment / Work Situation Employment Situation: Unemployed Patient's Job has Been Impacted by  Current Illness: No Has Patient ever Been in Equities Trader?: No  Education: Education Is Patient Currently Attending School?:  (uta) Last Grade Completed: 12 Did You Attend College?: Yes What Type of College Degree Do you Have?: reports some college Did You Have An Individualized Education Program (IIEP): No Did You Have Any Difficulty At School?: No Patient's Education Has Been Impacted by Current Illness: No   CCA Family/Childhood History Family and Relationship History: Family history Marital status:  (uta) Does patient have children?: No  Childhood History:  Childhood History By whom was/is the  patient raised?: Both parents Did patient suffer any verbal/emotional/physical/sexual abuse as a child?: No Did patient suffer from severe childhood neglect?: No Has patient ever been sexually abused/assaulted/raped as an adolescent or adult?:  (uta) Was the patient ever a victim of a crime or a disaster?:  (uta) Witnessed domestic violence?: No Has patient been affected by domestic violence as an adult?: No       CCA Substance Use Alcohol/Drug Use: Alcohol / Drug Use Pain Medications: Pt denies use Prescriptions: Pt denies abuse Over the Counter: Pt denies use History of alcohol / drug use?: Yes (Pt reports he has used marijuana in the past. Last used 3-4 months ago.) Longest period of sobriety (when/how long): NA                         ASAM's:  Six Dimensions of Multidimensional Assessment  Dimension 1:  Acute Intoxication and/or Withdrawal Potential:      Dimension 2:  Biomedical Conditions and Complications:      Dimension 3:  Emotional, Behavioral, or Cognitive Conditions and Complications:     Dimension 4:  Readiness to Change:     Dimension 5:  Relapse, Continued use, or Continued Problem Potential:     Dimension 6:  Recovery/Living Environment:     ASAM Severity Score:    ASAM Recommended Level of Treatment:     Substance use Disorder (SUD)     Recommendations for Services/Supports/Treatments:    Disposition Recommendation per psychiatric provider: We recommend inpatient psychiatric hospitalization after medical hospitalization. Patient has been involuntarily committed on 08/03/24.    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

## 2024-08-04 NOTE — Group Note (Signed)
 Date:  08/04/2024 Time:  3:49 PM  Group Topic/Focus: Sleep Hygiene Dimensions of Wellness:   The focus of this group is to introduce the topic of wellness and discuss the role each dimension of wellness plays in total health.    Participation Level:  Did Not Attend   Shanda JONETTA Challenger 08/04/2024, 3:49 PM

## 2024-08-04 NOTE — ED Notes (Signed)
 Patient has eaten all his breakfast and drank his juice

## 2024-08-04 NOTE — ED Notes (Signed)
Patient's mother at bedside visiting. 

## 2024-08-04 NOTE — ED Notes (Signed)
Patient resting comfortably in bed.  No signs of distress noted.

## 2024-08-04 NOTE — BHH Group Notes (Signed)
 Adult Psychoeducational Group Note  Date:  08/04/2024 Time:  8:35 PM  Group Topic/Focus:  Wrap-Up Group:   The focus of this group is to help patients review their daily goal of treatment and discuss progress on daily workbooks.  Participation Level:  Did Not Attend  Jon Branch 08/04/2024, 8:35 PM

## 2024-08-04 NOTE — Plan of Care (Signed)
   Problem: Education: Goal: Knowledge of [place] [facility] Education information/materials will improve Outcome: Progressing Goal: Emotional status will improve Outcome: Progressing Goal: Mental status will improve Outcome: Progressing Goal: Verbalization of understanding the information provided will improve Outcome: Progressing

## 2024-08-04 NOTE — ED Notes (Signed)
 Called report to Sabina at [facility]

## 2024-08-05 ENCOUNTER — Encounter (HOSPITAL_COMMUNITY): Payer: Self-pay

## 2024-08-05 LAB — LIPID PANEL
Cholesterol: 98 mg/dL (ref 0–200)
HDL: 38 mg/dL — ABNORMAL LOW (ref >40)
LDL Cholesterol: 50 mg/dL (ref 0–99)
Total CHOL/HDL Ratio: 2.6 ratio
Triglycerides: 51 mg/dL (ref <150)
VLDL: 10 mg/dL (ref 0–40)

## 2024-08-05 LAB — TSH: TSH: 3.55 u[IU]/mL (ref 0.350–4.500)

## 2024-08-05 MED ORDER — PANTOPRAZOLE SODIUM 40 MG PO TBEC
40.0000 mg | DELAYED_RELEASE_TABLET | Freq: Every day | ORAL | Status: DC
  Administered 2024-08-05 – 2024-08-09 (×4): 40 mg via ORAL
  Filled 2024-08-05 (×5): qty 1

## 2024-08-05 MED ORDER — PROPRANOLOL HCL 10 MG PO TABS
10.0000 mg | ORAL_TABLET | Freq: Two times a day (BID) | ORAL | Status: DC
  Administered 2024-08-05 – 2024-08-09 (×8): 10 mg via ORAL
  Filled 2024-08-05 (×8): qty 1

## 2024-08-05 NOTE — H&P (Signed)
 Psychiatric Admission Assessment Adult  Patient Identification: Jon Branch MRN:  [id number] Date of Evaluation:  08/05/2024 Chief Complaint:  MDD (major depressive disorder) [F32.9] Principal Diagnosis: MDD (major depressive disorder) Diagnosis:  Principal Problem:   MDD (major depressive disorder)  CC:  I am here for my treatment for mental health.  History of Present Illness:Jon Branch is a 23 year old African-American male with prior psychiatric diagnoses significant for major depressive disorder ordered recurrent, severe, with psychosis, schizophreniform disorder, cannabis use, substance-induced mood disorder. Patient presented involuntarily to [facility] from [facility] health ED at [facility] for failure to take his psychotropic medications with mental health decompensation.  BAL less than 15, UDS negative for any substances.  After medical evaluation, stabilization, and clearance patient was transferred to [facility] for further psychiatric evaluation and treatment.  As per IVC paperwork: Family reports that he has not eaten in 2 weeks he has been refusing to take his medication because he believes it is bad and he is not tending to his personal hygiene. They report he has not showered in over a week and he is speaking nonsense. Patient denies any drug or alcohol use. He denies a headache. He denies any abdominal pain at this time and denies any nausea or vomiting. Police report that they witnessed patient drinking water  when they arrived. He has been cooperative with them.   During this evaluation, patient reports, I am here to get my medication restarted because of not taking it in the past one week.  He reports family sent him here for no reason.  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  Stated I had injury to my left knee & quit driving the truck. Dayron reports he lives with  his parents and confirms the information in the IVC that he has not been taking his medications and has not been eating. He reports his medications don't make him feel good. He states he hasn't been eating because he feels like he shouldn't eat as much because its not healthy. He answers questions with random statements. He states he had a provider for medication management but does not remember who they are. He denies SI/HI, NSSIB and AVH at this time. He states he stopped taking his medications a few days ago. He denies history of substance abuse. He denies any depressive symptoms outside of his poor hygiene and decreased appetite.   Objective: Patient presents alert, cooperative, and oriented to person, time, place, and partially situation.  Speech is clear however, delayed with response and occasionally repeated the question back to this provider.  Objectively, not responding to internal or external stimuli.  Denies delusional thinking or paranoia.  Reports he is compliant with his medications and that is the goal for being here.  He denies SI, HI, or AVH.  Vital signs reviewed with blood pressure 130/85, and HR 117.  Patient remains asymptomatic.  Labs and EKG reviewed as indicated in the treatment plan.  Patient is admitted for mood stabilization, medication management, and safety.  Mode of transport to Hospital:Safe Transport Current Outpatient (Home) Medication List:See home medication listing PRN medication prior to evaluation:See home medication listing  ED course:Labs and EKG were obtained and analyzed.  Collateral Information:None POA/Legal Guardian:  Past Psychiatric Hx: Previous Psych Diagnoses: Major depressive disorder, recurrent, with psychotic features Prior inpatient treatment: Patient report yes 2 years ago however does not know where he  was admitted Current/prior outpatient treatment: Denies Prior rehab hx: Denies Psychotherapy hx: Yes History of suicide: Denies History of  homicide or aggression: Denies Psychiatric medication history: Denies Psychiatric medication compliance history: Noncompliance Neuromodulation history: Denies Current Psychiatrist: Reports he has a psychiatrist but does not know who Current therapist: Denies  Substance Abuse Hx: Alcohol: Denies Tobacco: Denies Illicit drugs: Denies Rx drug abuse: Denies Rehab hx: Denies  Past Medical History: Medical Diagnoses: Hypertension and GERD Home Rx: No home medication listed Prior Hosp: None, denies Prior Surgeries/Trauma: Denies Head trauma, LOC, concussions, seizures: denies Allergies:  Allergies    Losartan  Drug Ingredient Swelling High  04/17/2022 Past Updates    Porcine (Pork) Protein-containing Drug Products  Drug Class  Not Specified  10/14/2018 Past Updates     LMP: N/A Contraception: N/A PCP: Denies  Family History: Medical: Hypertension and diabetes Psych: Patient unsure Psych Rx: Patient unsure SA/HA: Patient unsure Substance use family hx: Patient unsure  Social History: Childhood (bring, raised, lives now, parents, siblings, schooling, education): High school diploma Abuse: Denies history of abuse Marital Status: Single Sexual orientation: No from birth Children: No children Employment: Does Energy Manager Group: Denies peer group Housing: Domiciled with the parents Finances: Some financial difficulty Legal: Denies Special Educational Needs Teacher: Denies affiliation with the [employer]  Associated Signs/Symptoms: Depression Symptoms:  fatigue, anxiety, (Hypo) Manic Symptoms:  N/A Anxiety Symptoms:  Excessive Worry, Psychotic Symptoms:  Denies PTSD Symptoms: NA  Total Time spent with patient: 1.5 hours  Is the patient at risk to self? No.  Has the patient been a risk to self in the past 6 months? Yes.    Has the patient been a risk to self within the distant past? Yes.    Is the patient a risk to others? No.  Has the patient been a risk to others in the past  6 months? No.  Has the patient been a risk to others within the distant past? No.   [place]ia Scale:  Flowsheet Row [facility] (Current) [facility] from 08/03/2024 in [facility] Department at [facility] ED from 08/01/2024 in [facility] Emergency Department at [facility]  C-SSRS RISK CATEGORY No Risk No Risk No Risk   Alcohol Screening: 1. How often do you have a drink containing alcohol?: Never 2. How many drinks containing alcohol do you have on a typical day when you are drinking?: 1 or 2 3. How often do you have six or more drinks on one occasion?: Never AUDIT-C Score: 0 4. How often during the last year have you found that you were not able to stop drinking once you had started?: Never 5. How often during the last year have you failed to do what was normally expected from you because of drinking?: Never 6. How often during the last year have you needed a first drink in the morning to get yourself going after a heavy drinking session?: Never 7. How often during the last year have you had a feeling of guilt of remorse after drinking?: Never 8. How often during the last year have you been unable to remember what happened the night before because you had been drinking?: Never 9. Have you or someone else been injured as a result of your drinking?: No 10. Has a relative or friend or a doctor or another health worker been concerned about your drinking or suggested you cut down?: No Alcohol Use Disorder Identification Test Final  Score (AUDIT): 0 Alcohol Brief Interventions/Follow-up: Patient Refused (does not drink)  Substance Abuse History in the last 12 months:  No.  Consequences of Substance Abuse: NA Previous Psychotropic Medications: Yes  Psychological Evaluations: Yes  Past Medical History:  Past Medical History:  Diagnosis Date   GERD (gastroesophageal reflux disease)    Hypertension     Past  Surgical History:  Procedure Laterality Date   EYE SURGERY     Family History:  Family History  Problem Relation Age of Onset   High blood pressure Mother    Diabetes Mother    High blood pressure Father    High blood pressure Maternal Grandmother    Diabetes Maternal Grandmother    High blood pressure Paternal Grandmother    Colon cancer Neg Hx    Tobacco Screening: Tobacco Use History[1]  BH Tobacco Counseling     Are you interested in Tobacco Cessation Medications?  N/A, patient does not use tobacco products Counseled patient on smoking cessation:  N/A, patient does not use tobacco products Reason Tobacco Screening Not Completed: No value filed.    Social History:  Social History   Substance and Sexual Activity  Alcohol Use Never     Social History   Substance and Sexual Activity  Drug Use Not Currently   Types: Marijuana    Additional Social History: Marital status: Single Does patient have children?: No    Allergies:  Allergies[2] Lab Results:  Results for orders placed or performed during the hospital encounter of 08/03/24 (from the past 48 hours)  CBC with Differential     Status: Abnormal   Collection Time: 08/03/24  2:36 PM  Result Value Ref Range   WBC 14.9 (H) 4.0 - 10.5 K/uL   RBC 5.19 4.22 - 5.81 MIL/uL   Hemoglobin 14.4 13.0 - 17.0 g/dL   HCT 53.9 60.9 - 47.9 %   MCV 88.6 80.0 - 100.0 fL   MCH 27.7 26.0 - 34.0 pg   MCHC 31.3 30.0 - 36.0 g/dL   RDW 87.4 88.4 - 84.4 %   Platelets 379 150 - 400 K/uL   nRBC 0.0 0.0 - 0.2 %   Neutrophils Relative % 82 %   Neutro Abs 12.3 (H) 1.7 - 7.7 K/uL   Lymphocytes Relative 11 %   Lymphs Abs 1.6 0.7 - 4.0 K/uL   Monocytes Relative 4 %   Monocytes Absolute 0.6 0.1 - 1.0 K/uL   Eosinophils Relative 2 %   Eosinophils Absolute 0.3 0.0 - 0.5 K/uL   Basophils Relative 1 %   Basophils Absolute 0.1 0.0 - 0.1 K/uL   Immature Granulocytes 0 %   Abs Immature Granulocytes 0.06 0.00 - 0.07 K/uL    Comment: Performed  at [facility], [address], [place], [place] [address]  Ethanol     Status: None   Collection Time: 08/03/24  2:36 PM  Result Value Ref Range   Alcohol, Ethyl (B) <15 <15 mg/dL    Comment: (NOTE) For medical purposes only. Performed at [facility], [address], [place], [place] [address]   Urine Drug Screen     Status: None   Collection Time: 08/03/24  2:49 PM  Result Value Ref Range   Opiates NEGATIVE NEGATIVE   Cocaine NEGATIVE NEGATIVE   Benzodiazepines NEGATIVE NEGATIVE   Amphetamines NEGATIVE NEGATIVE   Tetrahydrocannabinol NEGATIVE NEGATIVE   Barbiturates NEGATIVE NEGATIVE   Methadone Scn, Ur NEGATIVE NEGATIVE   Fentanyl  NEGATIVE NEGATIVE  Comment: (NOTE) Drug screen is for Medical Purposes only. Positive results are preliminary only. If confirmation is needed, notify lab within 5 days.  Drug Class                 Cutoff (ng/mL) Amphetamine and metabolites 1000 Barbiturate and metabolites 200 Benzodiazepine              200 Opiates and metabolites     300 Cocaine and metabolites     300 THC                         50 Fentanyl                     5 Methadone                   300  Trazodone is metabolized in vivo to several metabolites,  including pharmacologically active m-CPP, which is excreted in the  urine.  Immunoassay screens for amphetamines and MDMA have potential  cross-reactivity with these compounds and may provide false positive  result.  Performed at [facility], [address], [place], [place] [address]    Blood Alcohol level:  Lab Results  Component Value Date   [facility] <15 08/03/2024   ETH <10 07/23/2022   Metabolic Disorder Labs:  No results found for: HGBA1C, MPG No results found for: PROLACTIN No results found for: CHOL, TRIG, HDL, CHOLHDL, VLDL, LDLCALC  Current Medications: Current Facility-Administered Medications  Medication Dose Route Frequency  Provider Last Rate Last Admin   acetaminophen  (TYLENOL ) tablet 650 mg  650 mg Oral Q6H PRN Motley-Mangrum, Jadeka A, PMHNP       alum & mag hydroxide-simeth (MAALOX/MYLANTA) 200-200-20 MG/5ML suspension 30 mL  30 mL Oral Q4H PRN Motley-Mangrum, Jadeka A, PMHNP       benztropine  (COGENTIN ) tablet 0.5 mg  0.5 mg Oral BID Motley-Mangrum, Jadeka A, PMHNP   0.5 mg at 08/05/24 0820   haloperidol  (HALDOL ) tablet 5 mg  5 mg Oral TID PRN Motley-Mangrum, Jadeka A, PMHNP       And   diphenhydrAMINE  (BENADRYL ) capsule 50 mg  50 mg Oral TID PRN Motley-Mangrum, Jadeka A, PMHNP       haloperidol  lactate (HALDOL ) injection 5 mg  5 mg Intramuscular TID PRN Motley-Mangrum, Jadeka A, PMHNP       And   diphenhydrAMINE  (BENADRYL ) injection 50 mg  50 mg Intramuscular TID PRN Motley-Mangrum, Jadeka A, PMHNP       And   LORazepam  (ATIVAN ) injection 2 mg  2 mg Intramuscular TID PRN Motley-Mangrum, Jadeka A, PMHNP       haloperidol  lactate (HALDOL ) injection 10 mg  10 mg Intramuscular TID PRN Motley-Mangrum, Jadeka A, PMHNP       And   diphenhydrAMINE  (BENADRYL ) injection 50 mg  50 mg Intramuscular TID PRN Motley-Mangrum, Jadeka A, PMHNP       And   LORazepam  (ATIVAN ) injection 2 mg  2 mg Intramuscular TID PRN Motley-Mangrum, Jadeka A, PMHNP       hydrOXYzine  (ATARAX ) tablet 25 mg  25 mg Oral TID PRN Motley-Mangrum, Jadeka A, PMHNP       magnesium  hydroxide (MILK OF MAGNESIA) suspension 30 mL  30 mL Oral Daily PRN Motley-Mangrum, Jadeka A, PMHNP       risperiDONE  (RISPERDAL ) tablet 1.5 mg  1.5 mg Oral BID Motley-Mangrum, Jadeka A, PMHNP   1.5 mg at 08/05/24 0820   PTA Medications:  No medications prior to admission.   AIMS:  ,  ,  ,  ,  ,  ,    Musculoskeletal: Strength & Muscle Tone: within normal limits Gait & Station: normal Patient leans: N/A  Psychiatric Specialty Exam:  Presentation  General Appearance:  Appropriate for Environment; Casual  Eye Contact: Fair  Speech: Clear and Coherent  Speech  Volume: Normal  Handedness: Right  Mood and Affect  Mood: Angry; Irritable; Depressed  Affect: Congruent  Thought Process  Thought Processes: Coherent  Duration of Psychotic Symptoms:Denies Past Diagnosis of Schizophrenia or Psychoactive disorder: Yes  Descriptions of Associations:Intact  Orientation:Full (Time, Place and Person)  Thought Content:Paranoid Ideation  Hallucinations:Hallucinations: None  Ideas of Reference:Paranoia  Suicidal Thoughts:Suicidal Thoughts: No  Homicidal Thoughts:Homicidal Thoughts: No  Sensorium  Memory: Immediate Fair; Recent Fair  Judgment: Poor  Insight: Poor  Executive Functions  Concentration: Fair  Attention Span: Fair  Recall: Fair  Fund of Knowledge: Fair  Language: Fair  Psychomotor Activity  Psychomotor Activity: Psychomotor Activity: Normal  Assets  Assets: Physical Health; Resilience; Social Support  Sleep  Sleep: Sleep: Good  Estimated Sleeping Duration (Last 24 Hours): 8.75-10.00 hours  Physical Exam: Physical Exam Vitals and nursing note reviewed.  Constitutional:      General: He is not in acute distress.    Appearance: He is not ill-appearing.  HENT:     Head: Normocephalic.     Right Ear: External ear normal.     Left Ear: External ear normal.     Nose: Nose normal.     Mouth/Throat:     Mouth: Mucous membranes are moist.     Pharynx: Oropharynx is clear.  Eyes:     Extraocular Movements: Extraocular movements intact.  Cardiovascular:     Rate and Rhythm: Tachycardia present.  Pulmonary:     Effort: Pulmonary effort is normal. No respiratory distress.  Musculoskeletal:        General: Normal range of motion.     Cervical back: Normal range of motion.  Skin:    General: Skin is dry.  Neurological:     General: No focal deficit present.     Mental Status: He is alert and oriented to person, place, and time.  Psychiatric:        Mood and Affect: Mood normal.         Behavior: Behavior normal.        Thought Content: Thought content normal.    Review of Systems  Constitutional:  Negative for chills and fever.  HENT:  Negative for sore throat.   Eyes:  Negative for blurred vision.  Respiratory:  Negative for cough, sputum production, shortness of breath and wheezing.   Cardiovascular:  Negative for chest pain and palpitations.  Gastrointestinal:  Negative for abdominal pain, diarrhea, heartburn, nausea and vomiting.  Genitourinary:  Negative for dysuria.  Musculoskeletal:  Negative for falls.  Skin:  Negative for itching and rash.  Neurological:  Negative for dizziness and headaches.  Endo/Heme/Allergies: Negative.   Psychiatric/Behavioral:  Positive for depression. Negative for substance abuse and suicidal ideas. The patient is nervous/anxious. The patient does not have insomnia.    Blood pressure (!) 142/94, pulse (!) 114, temperature 98.4 F (36.9 C), resp. rate 20, height 5' 11 (1.803 m), weight 87.1 kg, SpO2 98%. Body mass index is 26.78 kg/m.  Treatment Plan Summary: Daily contact with patient to assess and evaluate symptoms and progress in treatment and Medication management  Observation Level/Precautions:  15 minute checks  Laboratory:   CBC: RBC 14.9, neutrophil 12.3, otherwise normal Chemistry Profile: CO2 21 low, anion gap 19 high, total bilirubin 1.3 high, otherwise normal Folic Acid: N/A GGT: N/A TSH: Ordered Lipid panel: Ordered HbAIC:Ordered HCG: N/A BAL: Less than 15 UDS:UA: Vitamin B-12: N/A  EKG: NSR, ventricular rate 97, QT/QTc 330/419  Psychotherapy: Therapeutic milieu  Medications: See MAR  Consultations: N/A  Discharge Concerns: Safety  Estimated LOS: 3 to 7 days  Other:     Assessment:  Physician Treatment Plan for Primary Diagnosis: MDD (major depressive disorder)  Plans: Medications: --Continue risperidone  1.5 mg tablet p.o. twice daily for psychosis --Continue Cogentin  0.5 mg tablet p.o. twice daily  for EPS prevention --Continue hydroxyzine  tablet 25 mg p.o. 3 times daily as needed for anxiety  Continue [facility] agitation protocol as recommended  Medications for other medical conditions: -- Initiate propranolol  tablets 10 mg p.o. twice daily for hypertension -- Initiate Protonix  EC tablet 40 mg p.o. daily for GERD  Other PRN Medications  -Acetaminophen  650 mg every 6 as needed/mild pain  -Maalox 30 mL oral every 4 as needed/digestion  -Magnesium  hydroxide 30 mL daily as needed/mild constipation   --The risks/benefits/side-effects/alternatives to this medication were discussed in detail with the patient and time was given for questions. The patient consents to medication trial.   -- Metabolic profile and EKG monitoring obtained while on an atypical antipsychotic (BMI: Lipid Panel: HbgA1c: QTc:)   -- Encouraged patient to participate in unit milieu and in scheduled group therapies   Safety and Monitoring:  Voluntary admission to inpatient psychiatric unit for safety, stabilization and treatment  Daily contact with patient to assess and evaluate symptoms and progress in treatment  Patient's case to be discussed in multi-disciplinary team meeting  Observation Level : q15 minute checks  Vital signs: q12 hours  Precautions: suicide, but pt currently verbally contracts for safety on unit?   Discharge Planning:  Social work and case management to assist with discharge planning and identification of hospital follow-up needs prior to discharge  Estimated LOS: 5-7?days  Discharge Concerns: Need to establish Safety plan; Medication compliance and effectiveness  Discharge Goals: Return home with outpatient referrals for mental health follow-up including medication management/psychotherapy.   Long Term Goal(s): Improvement in symptoms so as ready for discharge  Short Term Goals: Ability to identify changes in lifestyle to reduce recurrence of condition will improve, Ability to verbalize feelings  will improve, Ability to disclose and discuss suicidal ideas, Ability to demonstrate self-control will improve, Ability to identify and develop effective coping behaviors will improve, Ability to maintain clinical measurements within normal limits will improve, Compliance with prescribed medications will improve, and Ability to identify triggers associated with substance abuse/mental health issues will improve  Physician Treatment Plan for Secondary Diagnosis: Principal Problem:   MDD (major depressive disorder)  I certify that inpatient services furnished can reasonably be expected to improve the patient's condition.    Ellouise JAYSON Azure, FNP 12/17/202510:35 AM     [1]  Social History Tobacco Use  Smoking Status Former   Types: Cigarettes  Smokeless Tobacco Never  [2]  Allergies Allergen Reactions   Losartan Swelling   Porcine (Pork) Protein-Containing Drug Products

## 2024-08-05 NOTE — [facility] Counselor (Signed)
 Adult Comprehensive Assessment  Patient ID: Jon Branch, male   DOB: 11/24/2000, 23 y.o.   MRN: [id number]  Information Source: Information source: Patient  Current Stressors:  Patient states their primary concerns and needs for treatment are:: Getting treatment at the hospital and they sent me here for treatment. I have mood problems Patient states their goals for this hospitilization and ongoing recovery are:: To get medication and bee good. Educational / Learning stressors: None reported Employment / Job issues: Leg pain affect me being able to work. Family Relationships: None reported Financial / Lack of resources (include bankruptcy): If im not able to work cause of leg pain I want have money Housing / Lack of housing: None reported Physical health (include injuries & life threatening diseases): Leg pain and constipation. Social relationships: None reported Substance abuse: None reported Bereavement / Loss: None reported  Living/Environment/Situation:  Living Arrangements: Parent, Other relatives Living conditions (as described by patient or guardian): an apartment Who else lives in the home?: parents and siblings How long has patient lived in current situation?: Less than 1year What is atmosphere in current home: Comfortable  Family History:  Marital status: Single Does patient have children?: No  Childhood History:  By whom was/is the patient raised?: Both parents Description of patient's relationship with caregiver when they were a child: it was normal and when I got a mistake they correct me and when I am good it was good. Patient's description of current relationship with people who raised him/her: Its normal How were you disciplined when you got in trouble as a child/adolescent?: hit me, correct me Does patient have siblings?: Yes Number of Siblings: 3 Description of patient's current relationship with siblings: Normal Did patient suffer any  verbal/emotional/physical/sexual abuse as a child?: No Did patient suffer from severe childhood neglect?: No Has patient ever been sexually abused/assaulted/raped as an adolescent or adult?: No Was the patient ever a victim of a crime or a disaster?: No Witnessed domestic violence?: No Has patient been affected by domestic violence as an adult?: No  Education:  Highest grade of school patient has completed: high school Currently a consulting civil engineer?: No Learning disability?: No  Employment/Work Situation:   Employment Situation: Unemployed Patient's Job has Been Impacted by Current Illness: No What is the Longest Time Patient has Held a Job?: 1 year and couple months Where was the Patient Employed at that Time?: [employer] Has Patient ever Been in the [employer]?: No  Financial Resources:   Financial resources: Medicaid Does patient have a lawyer or guardian?: No  Alcohol/Substance Abuse:   What has been your use of drugs/alcohol within the last 12 months?: None reported If attempted suicide, did drugs/alcohol play a role in this?: No Alcohol/Substance Abuse Treatment Hx: Denies past history Has alcohol/substance abuse ever caused legal problems?: No  Social Support System:   Conservation Officer, Nature Support System: Good Describe Community Support System: I just have my family Type of faith/religion: Muslim How does patient's faith help to cope with current illness?: None reported  Leisure/Recreation:   Do You Have Hobbies?: Yes Leisure and Hobbies: basketball, soccer  Strengths/Needs:   Patient states these barriers may affect/interfere with their treatment: None reported Patient states these barriers may affect their return to the community: None reported Other important information patient would like considered in planning for their treatment: None reported  Discharge Plan:   Currently receiving community mental health services: No Patient states concerns  and preferences for aftercare planning are: I dont know  if I need it (may be got back to previous one, [facility]) Patient states they will know when they are safe and ready for discharge when: Im ready Does patient have access to transportation?: Yes Does patient have financial barriers related to discharge medications?: No Patient description of barriers related to discharge medications: None reported Will patient be returning to same living situation after discharge?: Yes  Summary/Recommendations:   The patient is a 23 year old male from [place] [facility] [place] who presented to the Emergency Department with reports of leg pain and constipation. The patient reports that he is here for treatment for the leg pain and constipation. Based on his IVC paperwork family reports that he has not eaten in 2 weeks he has been refusing to take his medication because he believes it is bad and he is not tending to his personal hygiene. They report he has not showered in over a week and he is speaking nonsense. Patient denies any drug or alcohol use. The patient reports that he is unemployed. The patient stated that he receives Medicaid. The patient reports living with his parents and sibling. The patient denies SI/HI and AVH. The patient denies any current mental health services. Recommendations include: crisis stabilization, therapeutic milieu, encourage group attendance and participation, medication management for detox/mood stabilization and development of comprehensive mental wellness plan.    Roselyn GORMAN Lento. 08/05/2024

## 2024-08-05 NOTE — [facility] Suicide Risk Assessment (Signed)
 [facility] INPATIENT:  Family/Significant Other Suicide Prevention Education  Suicide Prevention Education:  Contact Attempts: Dartagnan Beavers, dad, [phone], (name of family member/significant other) has been identified by the patient as the family member/significant other with whom the patient will be residing, and identified as the person(s) who will aid the patient in the event of a mental health crisis.  With written consent from the patient, two attempts were made to provide suicide prevention education, prior to and/or following the patient's discharge.  We were unsuccessful in providing suicide prevention education.  A suicide education pamphlet was given to the patient to share with family/significant other.  Date and time of first attempt:12/17/@1208    Jon Branch LCSWA 08/05/2024, 12:08 PM

## 2024-08-05 NOTE — Progress Notes (Signed)
(  Sleep Hours) -10 (Any PRNs that were needed, meds refused, or side effects to meds)-none  (Any disturbances and when (visitation, over night)-none (Concerns raised by the patient)- none (SI/HI/AVH)-endorsed AH

## 2024-08-05 NOTE — Group Note (Signed)
 Date:  08/05/2024 Time:  10:09 AM  Group Topic/Focus: Recreational Therapy    Pt did not attend recreational therapy group  Keiasha Diep R Shawntay Prest 08/05/2024, 10:09 AM

## 2024-08-05 NOTE — BH IP Treatment Plan (Signed)
 Interdisciplinary Treatment and Diagnostic Plan Update  08/05/2024 Time of Session: 1005 AM Jon Branch MRN: [id number]  Principal Diagnosis: MDD (major depressive disorder)  Secondary Diagnoses: Principal Problem:   MDD (major depressive disorder)   Current Medications:  Current Facility-Administered Medications  Medication Dose Route Frequency Provider Last Rate Last Admin   acetaminophen  (TYLENOL ) tablet 650 mg  650 mg Oral Q6H PRN Motley-Mangrum, Jadeka A, PMHNP       alum & mag hydroxide-simeth (MAALOX/MYLANTA) 200-200-20 MG/5ML suspension 30 mL  30 mL Oral Q4H PRN Motley-Mangrum, Jadeka A, PMHNP       benztropine  (COGENTIN ) tablet 0.5 mg  0.5 mg Oral BID Motley-Mangrum, Jadeka A, PMHNP   0.5 mg at 08/05/24 0820   haloperidol  (HALDOL ) tablet 5 mg  5 mg Oral TID PRN Motley-Mangrum, Jadeka A, PMHNP       And   diphenhydrAMINE  (BENADRYL ) capsule 50 mg  50 mg Oral TID PRN Motley-Mangrum, Jadeka A, PMHNP       haloperidol  lactate (HALDOL ) injection 5 mg  5 mg Intramuscular TID PRN Motley-Mangrum, Jadeka A, PMHNP       And   diphenhydrAMINE  (BENADRYL ) injection 50 mg  50 mg Intramuscular TID PRN Motley-Mangrum, Jadeka A, PMHNP       And   LORazepam  (ATIVAN ) injection 2 mg  2 mg Intramuscular TID PRN Motley-Mangrum, Jadeka A, PMHNP       haloperidol  lactate (HALDOL ) injection 10 mg  10 mg Intramuscular TID PRN Motley-Mangrum, Jadeka A, PMHNP       And   diphenhydrAMINE  (BENADRYL ) injection 50 mg  50 mg Intramuscular TID PRN Motley-Mangrum, Jadeka A, PMHNP       And   LORazepam  (ATIVAN ) injection 2 mg  2 mg Intramuscular TID PRN Motley-Mangrum, Jadeka A, PMHNP       hydrOXYzine  (ATARAX ) tablet 25 mg  25 mg Oral TID PRN Motley-Mangrum, Jadeka A, PMHNP       magnesium  hydroxide (MILK OF MAGNESIA) suspension 30 mL  30 mL Oral Daily PRN Motley-Mangrum, Jadeka A, PMHNP       risperiDONE  (RISPERDAL ) tablet 1.5 mg  1.5 mg Oral BID Motley-Mangrum, Jadeka A, PMHNP   1.5 mg at 08/05/24 0820    PTA Medications: No medications prior to admission.    Patient Stressors: Medication change or noncompliance    Patient Strengths: Supportive family/friends   Treatment Modalities: Medication Management, Group therapy, Case management,  1 to 1 session with clinician, Psychoeducation, Recreational therapy.   Physician Treatment Plan for Primary Diagnosis: MDD (major depressive disorder) Long Term Goal(s): Improvement in symptoms so as ready for discharge   Short Term Goals: Ability to identify changes in lifestyle to reduce recurrence of condition will improve Ability to verbalize feelings will improve Ability to disclose and discuss suicidal ideas Ability to demonstrate self-control will improve Ability to identify and develop effective coping behaviors will improve Ability to maintain clinical measurements within normal limits will improve Compliance with prescribed medications will improve Ability to identify triggers associated with substance abuse/mental health issues will improve  Medication Management: Evaluate patient's response, side effects, and tolerance of medication regimen.  Therapeutic Interventions: 1 to 1 sessions, Unit Group sessions and Medication administration.  Evaluation of Outcomes: Not Progressing  Physician Treatment Plan for Secondary Diagnosis: Principal Problem:   MDD (major depressive disorder)  Long Term Goal(s): Improvement in symptoms so as ready for discharge   Short Term Goals: Ability to identify changes in lifestyle to reduce recurrence of condition will improve Ability to verbalize  feelings will improve Ability to disclose and discuss suicidal ideas Ability to demonstrate self-control will improve Ability to identify and develop effective coping behaviors will improve Ability to maintain clinical measurements within normal limits will improve Compliance with prescribed medications will improve Ability to identify triggers associated  with substance abuse/mental health issues will improve     Medication Management: Evaluate patient's response, side effects, and tolerance of medication regimen.  Therapeutic Interventions: 1 to 1 sessions, Unit Group sessions and Medication administration.  Evaluation of Outcomes: Not Progressing   RN Treatment Plan for Primary Diagnosis: MDD (major depressive disorder) Long Term Goal(s): Knowledge of disease and therapeutic regimen to maintain health will improve  Short Term Goals: Ability to verbalize feelings will improve, Ability to disclose and discuss suicidal ideas, and Ability to identify and develop effective coping behaviors will improve  Medication Management: RN will administer medications as ordered by provider, will assess and evaluate patient's response and provide education to patient for prescribed medication. RN will report any adverse and/or side effects to prescribing provider.  Therapeutic Interventions: 1 on 1 counseling sessions, Psychoeducation, Medication administration, Evaluate responses to treatment, Monitor vital signs and CBGs as ordered, Perform/monitor CIWA, COWS, AIMS and Fall Risk screenings as ordered, Perform wound care treatments as ordered.  Evaluation of Outcomes: Not Progressing   LCSW Treatment Plan for Primary Diagnosis: MDD (major depressive disorder) Long Term Goal(s): Safe transition to appropriate next level of care at discharge, Engage patient in therapeutic group addressing interpersonal concerns.  Short Term Goals: Engage patient in aftercare planning with referrals and resources, Increase social support, Increase emotional regulation, Facilitate acceptance of mental health diagnosis and concerns, and Increase skills for wellness and recovery  Therapeutic Interventions: Assess for all discharge needs, 1 to 1 time with Social worker, Explore available resources and support systems, Assess for adequacy in community support network, Educate  family and significant other(s) on suicide prevention, Complete Psychosocial Assessment, Interpersonal group therapy.  Evaluation of Outcomes: Not Progressing   Progress in Treatment: Attending groups: No. Participating in groups: No. Taking medication as prescribed: Yes. Toleration medication: Yes. Family/Significant other contact made: No, will contact:   Jon Branch, dad, [phone] Patient understands diagnosis: Yes. Discussing patient identified problems/goals with staff: Yes. Medical problems stabilized or resolved: Yes. Denies suicidal/homicidal ideation: Yes. Issues/concerns per patient self-inventory: No.  New problem(s) identified: No, Describe:  none reported   New Short Term/Long Term Goal(s): medication stabilization, elimination of SI thoughts, development of comprehensive mental wellness plan.   Patient Goals:  Getting medications  Discharge Plan or Barriers: Patient recently admitted. CSW will continue to follow and assess for appropriate referrals and possible discharge planning.    Reason for Continuation of Hospitalization: Depression Medication stabilization  Estimated Length of Stay: 4-6 days  Last 3 [place] Suicide Severity Risk Score: Flowsheet Row Ad[facility] (Current) [facility]03/2024 in [facility] ED from 08/01/2024 in [facility] at [facility]  C-SSRS RISK CATEGORY No Risk No Risk No Risk    Last PHQ 2/9 Scores:     No data to display          Scribe for Treatment Team: Jenkins LULLA Primer, LCSWA 08/05/2024 11:56 AM

## 2024-08-05 NOTE — Plan of Care (Signed)
   Problem: Education: Goal: Emotional status will improve Outcome: Not Progressing

## 2024-08-05 NOTE — Progress Notes (Signed)
°   08/05/24 1500  Psych Admission Type (Psych Patients Only)  Admission Status Involuntary  Psychosocial Assessment  Patient Complaints Isolation  Eye Contact Fair  Facial Expression Flat  Affect Flat  Speech Logical/coherent;Soft  Interaction Isolative  Motor Activity Other (Comment) (WDL)  Appearance/Hygiene Unremarkable  Behavior Characteristics Cooperative;Calm  Mood Pleasant  Thought Process  Coherency WDL  Content WDL  Delusions None reported or observed  Perception WDL  Hallucination None reported or observed  Judgment Poor  Confusion None  Danger to Self  Current suicidal ideation? Denies  Agreement Not to Harm Self Yes  Description of Agreement Verbal  Danger to Others  Danger to Others None reported or observed

## 2024-08-05 NOTE — Plan of Care (Signed)
   Problem: Education: Goal: Knowledge of [place] [facility] Education information/materials will improve Outcome: Progressing Goal: Emotional status will improve Outcome: Progressing Goal: Mental status will improve Outcome: Progressing Goal: Verbalization of understanding the information provided will improve Outcome: Progressing

## 2024-08-05 NOTE — Group Note (Signed)
 Date:  08/05/2024 Time:  3:18 PM  Group Topic/Focus: Spiritual Wellness with Chaplain    Pt did not attend spiritual wellness group with the chaplain  Avrielle Fry R Greycen Felter 08/05/2024, 3:18 PM

## 2024-08-05 NOTE — Group Note (Signed)
 Date:  08/05/2024 Time:  9:42 AM  Group Topic/Focus:  Goals Group:   The focus of this group is to help patients establish daily goals to achieve during treatment and discuss how the patient can incorporate goal setting into their daily lives to aide in recovery. Orientation:   The focus of this group is to educate the patient on the purpose and policies of crisis stabilization and provide a format to answer questions about their admission.  The group details unit policies and expectations of patients while admitted.    Participation Level:  Did Not Attend   Jon Branch 08/05/2024, 9:42 AM

## 2024-08-05 NOTE — Group Note (Signed)
 Date:  08/05/2024 Time:  3:54 PM  Group Topic/Focus: Motivation/Inspiration/Stages of Change  Stages of Change:   The focus of this group is to explain the stages of change and help patients identify changes they want to make upon discharge.    Participation Level:  Did Not Attend    Shanda JONETTA Challenger 08/05/2024, 3:54 PM
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
## 2024-08-05 NOTE — Group Note (Signed)
 Date:  08/05/2024 Time:  12:42 PM  Group Topic/Focus: Pharmacy    Pt did not attend pharmacy group  Deeric Cruise R Bader Stubblefield 08/05/2024, 12:42 PM

## 2024-08-05 NOTE — Group Note (Signed)
 Recreation Therapy Group Note   Group Topic:Communication  Group Date: 08/05/2024 Start Time: 0935 End Time: 0956 Facilitators: Lulie Hurd-McCall, LRT,CTRS Location: [address] Dayroom   Group Topic: Communication, Team Building, Problem Solving  Goal Area(s) Addresses:  Patient will effectively work with peer towards shared goal.  Patient will identify skills used to make activity successful.  Patient will identify how skills used during activity can be applied to reach post d/c goals.   Behavioral Response:   Intervention: STEM Activity- Glass Blower/designer  Activity: Tallest [employer]. In teams of 5-6, patients were given 11 craft pipe cleaners. Using the materials provided, patients were instructed to compete again the opposing team(s) to build the tallest free-standing structure from floor level. The activity was timed; difficulty increased by clinical research associate as production designer, theatre/television/film continued.  Systematically resources were removed with additional directions for example, placing one arm behind their back, working in silence, and shape stipulations. LRT facilitated post-activity discussion reviewing team processes and necessary communication skills involved in completion. Patients were encouraged to reflect how the skills utilized, or not utilized, in this activity can be incorporated to positively impact support systems post discharge.  Education: Pharmacist, Community, Scientist, Physiological, Discharge Planning   Education Outcome: Acknowledges education/In group clarification offered/Needs additional education.    Affect/Mood: N/A   Participation Level: Did not attend    Clinical Observations/Individualized Feedback:      Plan: Continue to engage patient in RT group sessions 2-3x/week.   Tien Aispuro-McCall, LRT,CTRS 08/05/2024 12:22 PM

## 2024-08-06 LAB — HEMOGLOBIN A1C
Hgb A1c MFr Bld: 5.2 % (ref 4.8–5.6)
Mean Plasma Glucose: 102.54 mg/dL

## 2024-08-06 NOTE — Plan of Care (Signed)
  Problem: Education: Goal: Knowledge of [facility] Education information/materials will improve Outcome: Progressing Goal: Emotional status will improve Outcome: Progressing   Problem: Activity: Goal: Interest or engagement in activities will improve Outcome: Not Progressing

## 2024-08-06 NOTE — Group Note (Signed)
 Occupational Therapy Group Note  Group Topic:Coping Skills  Group Date: 08/06/2024 Start Time: 1500 End Time: 1530 Facilitators: Dot [place] MATSU, OT   Group Description: Group encouraged increased engagement and participation through discussion and activity focused on Coping Ahead. Patients were split up into teams and selected a card from a stack of positive coping strategies. Patients were instructed to act out/charade the coping skill for other peers to guess and receive points for their team. Discussion followed with a focus on identifying additional positive coping strategies and patients shared how they were going to cope ahead over the weekend while continuing hospitalization stay.  Therapeutic Goal(s): Identify positive vs negative coping strategies. Identify coping skills to be used during hospitalization vs coping skills outside of hospital/at home Increase participation in therapeutic group environment and promote engagement in treatment   Participation Level: Engaged   Participation Quality: Independent   Behavior: Appropriate   Speech/Thought Process: Relevant   Affect/Mood: Appropriate   Insight: Fair   Judgement: Fair      Modes of Intervention: Education  Patient Response to Interventions:  Attentive   Plan: Continue to engage patient in OT groups 2 - 3x/week.  08/06/2024  [place] MATSU Dot, OT   Bhavya Eschete, OT

## 2024-08-06 NOTE — Progress Notes (Signed)
(  Sleep Hours) -9.25 (Any PRNs that were needed, meds refused, or side effects to meds)- none (Any disturbances and when (visitation, over night)-none (Concerns raised by the patient)- none (SI/HI/AVH)-denied

## 2024-08-06 NOTE — Progress Notes (Signed)
(  Sleep Hours) -7  (Any PRNs that were needed, meds refused, or side effects to meds)- Mylanta 30mL  (Any disturbances and when (visitation, over night)-none  (Concerns raised by the patient)- patient c/o feeling full even hours after eating. Denies nausea or pain.  LBM-08/05/24. Patient agreed had a good BM yesterday. Miralax administered on dayshift. Mylanta administered as patient requesting med.  (SI/HI/AVH)-denies

## 2024-08-06 NOTE — [facility] Group Notes (Signed)
 Pt partially attended the OT group today, 08/06/24 ([id number])

## 2024-08-06 NOTE — Progress Notes (Signed)
 Patient denies SI, HI, AVH. Patient stated they slept Good last night. Scored zero on anxiety and feeling of hopelessness, and 1/10 on depression. Patient has been calm, cooperative, and med compliant.      08/06/24 0900  Psych Admission Type (Psych Patients Only)  Admission Status Involuntary  Psychosocial Assessment  Patient Complaints Isolation  Eye Contact Fair  Facial Expression Flat  Affect Flat  Speech Logical/coherent;Soft  Interaction Isolative  Motor Activity Other (Comment) (WDL)  Appearance/Hygiene Unremarkable  Behavior Characteristics Cooperative;Calm  Mood Pleasant  Thought Process  Coherency WDL  Content WDL  Delusions None reported or observed  Perception WDL  Hallucination None reported or observed  Judgment Poor  Confusion None  Danger to Self  Current suicidal ideation? Denies  Agreement Not to Harm Self Yes  Description of Agreement Verbal  Danger to Others  Danger to Others None reported or observed

## 2024-08-06 NOTE — Plan of Care (Signed)
   Problem: Education: Goal: Knowledge of [place] [facility] Education information/materials will improve Outcome: Progressing Goal: Emotional status will improve Outcome: Progressing Goal: Mental status will improve Outcome: Progressing Goal: Verbalization of understanding the information provided will improve Outcome: Progressing

## 2024-08-06 NOTE — [facility] Group Notes (Signed)
 Pt did not attend the nutrition group today, 08/06/24 ([id number])

## 2024-08-06 NOTE — [facility] Group Notes (Signed)
 Pt partially attended the CSW group today, 08/06/24 ([id number]); approx 15 min total

## 2024-08-06 NOTE — Group Note (Signed)
 LCSW Group Therapy Note   Group Date: 08/06/2024 Start Time: 1100 End Time: 1200   Participation:  patient was present and actively participated in the discussion.  Type of Therapy:  Group Therapy  Topic:  Speaking from the Heart: Communicating with Understanding and Empathy  Objective:  To help participants develop effective communication skills to express themselves clearly, listen actively, and navigate conflicts in a healthy way.  Goals: Increase awareness of verbal and non-verbal communication skills. Practice using I statements and active listening techniques. Learn coping strategies for managing communication stress.  Summary:  Participants explored the importance of communication, discussed challenges, and practiced skills such as active listening and assertive expression. They reflected on past experiences and identified ways to improve communication in their daily lives.  Therapeutic Modalities: -  Cognitive-Behavioral Therapy (CBT): Restructuring negative thought patterns in communication. -  Mindfulness: Staying present and calm during conversations. -  Psychoeducation: Learning about effective communication techniques.   Jony Ladnier O Safira Proffit, LCSWA 08/06/2024  12:32 PM

## 2024-08-06 NOTE — Group Note (Signed)
 Date:  08/06/2024 Time:  9:26 PM  Group Topic/Focus:  Wrap-Up Group:   The focus of this group is to help patients review their daily goal of treatment and discuss progress on daily workbooks.    Participation Level:  Did Not Attend  Participation Quality:  Did not attend group!  Affect:  Did not attend group!  Cognitive:  Did not attend group!  Insight: None; Did not attend group!  Engagement in Group:  None; Did not attend group!  Modes of Intervention:  Did not attend group!  Additional Comments:  Patient did not attend group! MHT will continue to encourage active participation. Dena JINNY Mace 08/06/2024, 9:26 PM

## 2024-08-06 NOTE — Progress Notes (Signed)
 [facility] MD Progress Note  08/06/2024 5:36 PM Jon [facility]  MRN:  [id number]  Principal Problem: MDD (major depressive disorder) Diagnosis: Principal Problem:   MDD (major depressive disorder)  Reason for admission: Jon [facility] is a 23 year old African-American male with prior psychiatric diagnoses significant for major depressive disorder ordered recurrent, severe, with psychosis, schizophreniform disorder, cannabis use, substance-induced mood disorder. Patient presented involuntarily to [facility] from [facility] health ED at [facility] for failure to take his psychotropic medications with mental health decompensation.  BAL less than 15, UDS negative for any substances.   24-hour chart review: Patient case discussed in interdisciplinary team.  Vital signs reviewed without critical values.  No as needed required.  No agitation protocol required.  Today's assessment notes: On assessment today, the pt reports that their mood is stable, euthymic, and less depressed.  Rates both depression and anxiety as numbers 0/10, with 10 being more severe.  He presents alert, cooperative, pleasant with smiles, and oriented to person, time, place, and situation.  Patient reports to this provider that he will return home with his parents after discharge from the [facility].  Compliant with psychotropic medications and denies having side effects to current psychiatric medications.  No EPS, cogwheel rigidity or TD observed during today's assessment. Added that he will eat with the parents as a family as requested by his mother. However, he will reduce his portion of food to maintain his weight and not get obese.  Reports, currently his appetite is good and would like to maintain his eating habits although his mom wants him to eat more food.  Presents with adequate hygiene, wearing clean clothes, clean teeth and good appearance.  Observed attending and participating in therapeutic milieu.  Speech is  clear, coherent, with normal volume and pattern.  Objectively not responding to internal or external stimuli. Thought process coherent, organized, and logical.  Thought content without delusional thinking or paranoia.  He denies suicidal ideation, homicidal ideation or AVH. Reports that anxiety is is minimal and manageable. Sleep is stable, and nursing staff reports patient sleeping over 9.25 hours and being restful. Appetite is improved. Concentration is without complaint. Energy level is adequate  Total Time spent with patient: 45 minutes  Past Psychiatric History: Previous Psych Diagnoses: Major depressive disorder, recurrent, with psychotic features Prior inpatient treatment: Patient report yes 2 years ago however does not know where he was admitted Current/prior outpatient treatment: Denies Prior rehab hx: Denies Psychotherapy hx: Yes History of suicide: Denies History of homicide or aggression: Denies Psychiatric medication history: Denies Psychiatric medication compliance history: Noncompliance Neuromodulation history: Denies Current Psychiatrist: Reports he has a psychiatrist but does not know who Current therapist: Denies   Past Medical History:  Past Medical History:  Diagnosis Date   GERD (gastroesophageal reflux disease)    Hypertension     Past Surgical History:  Procedure Laterality Date   EYE SURGERY     Family History:  Family History  Problem Relation Age of Onset   High blood pressure Mother    Diabetes Mother    High blood pressure Father    High blood pressure Maternal Grandmother    Diabetes Maternal Grandmother    High blood pressure Paternal Grandmother    Colon cancer Neg Hx    Family Psychiatric  History: See H&P Social History:  Social History   Substance and Sexual Activity  Alcohol Use Never     Social History   Substance and Sexual Activity  Drug Use  Not Currently   Types: Marijuana    Social History   Socioeconomic History   Marital  status: Single    Spouse name: Not on file   Number of children: Not on file   Years of education: Not on file   Highest education level: Not on file  Occupational History   Not on file  Tobacco Use   Smoking status: Former    Types: Cigarettes   Smokeless tobacco: Never  Vaping Use   Vaping status: Former   Substances: Flavoring  Substance and Sexual Activity   Alcohol use: Never   Drug use: Not Currently    Types: Marijuana   Sexual activity: Not Currently  Other Topics Concern   Not on file  Social History Narrative   ** Merged History Encounter **       Social Drivers of Health   Tobacco Use: Medium Risk (08/04/2024)   Patient History    Smoking Tobacco Use: Former    Smokeless Tobacco Use: Never    Passive Exposure: Not on Actuary Strain: Not on file  Food Insecurity: No Food Insecurity (08/04/2024)   Epic    Worried About Programme Researcher, Broadcasting/film/video in the Last Year: Never true    Ran Out of Food in the Last Year: Never true  Transportation Needs: No Transportation Needs (08/04/2024)   Epic    Lack of Transportation (Medical): No    Lack of Transportation (Non-Medical): No  Physical Activity: Not on file  Stress: Not on file  Social Connections: Not on file  Depression (EYV7-0): Not on file  Alcohol Screen: Low Risk (08/04/2024)   Alcohol Screen    Last Alcohol Screening Score (AUDIT): 0  Housing: Low Risk (08/04/2024)   Epic    Unable to Pay for Housing in the Last Year: No    Number of Times Moved in the Last Year: 0    Homeless in the Last Year: No  Utilities: Not At Risk (08/04/2024)   Epic    Threatened with loss of utilities: No  Health Literacy: Not on file   Additional Social History:    Sleep: Good Estimated Sleeping Duration (Last 24 Hours): 5.75-9.25 hours  Appetite:  Good  Current Medications: Current Facility-Administered Medications  Medication Dose Route Frequency Provider Last Rate Last Admin   acetaminophen  (TYLENOL )  tablet 650 mg  650 mg Oral Q6H PRN Motley-Mangrum, Jadeka A, PMHNP       alum & mag hydroxide-simeth (MAALOX/MYLANTA) 200-200-20 MG/5ML suspension 30 mL  30 mL Oral Q4H PRN Motley-Mangrum, Jadeka A, PMHNP       benztropine  (COGENTIN ) tablet 0.5 mg  0.5 mg Oral BID Motley-Mangrum, Jadeka A, PMHNP   0.5 mg at 08/06/24 1650   haloperidol  (HALDOL ) tablet 5 mg  5 mg Oral TID PRN Motley-Mangrum, Jadeka A, PMHNP       And   diphenhydrAMINE  (BENADRYL ) capsule 50 mg  50 mg Oral TID PRN Motley-Mangrum, Jadeka A, PMHNP       haloperidol  lactate (HALDOL ) injection 5 mg  5 mg Intramuscular TID PRN Motley-Mangrum, Jadeka A, PMHNP       And   diphenhydrAMINE  (BENADRYL ) injection 50 mg  50 mg Intramuscular TID PRN Motley-Mangrum, Jadeka A, PMHNP       And   LORazepam  (ATIVAN ) injection 2 mg  2 mg Intramuscular TID PRN Motley-Mangrum, Jadeka A, PMHNP       haloperidol  lactate (HALDOL ) injection 10 mg  10 mg Intramuscular TID PRN Motley-Mangrum, Jadeka  A, PMHNP       And   diphenhydrAMINE  (BENADRYL ) injection 50 mg  50 mg Intramuscular TID PRN Motley-Mangrum, Jadeka A, PMHNP       And   LORazepam  (ATIVAN ) injection 2 mg  2 mg Intramuscular TID PRN Motley-Mangrum, Jadeka A, PMHNP       hydrOXYzine  (ATARAX ) tablet 25 mg  25 mg Oral TID PRN Motley-Mangrum, Jadeka A, PMHNP       magnesium  hydroxide (MILK OF MAGNESIA) suspension 30 mL  30 mL Oral Daily PRN Motley-Mangrum, Jadeka A, PMHNP       pantoprazole  (PROTONIX ) EC tablet 40 mg  40 mg Oral Daily Cyrena Kuchenbecker C, FNP   40 mg at 08/06/24 0845   propranolol  (INDERAL ) tablet 10 mg  10 mg Oral BID Lincy Belles C, FNP   10 mg at 08/06/24 1650   risperiDONE  (RISPERDAL ) tablet 1.5 mg  1.5 mg Oral BID Motley-Mangrum, Jadeka A, PMHNP   1.5 mg at 08/06/24 1650   Lab Results:  Results for orders placed or performed during the Branch encounter of 08/04/24 (from the past 48 hours)  Lipid panel     Status: Abnormal   Collection Time: 08/05/24  6:41 PM  Result Value Ref  Range   Cholesterol 98 0 - 200 mg/dL    Comment:        ATP III CLASSIFICATION:  <200     mg/dL   Desirable  799-760  mg/dL   Borderline High  >=759    mg/dL   High           Triglycerides 51 <150 mg/dL   HDL 38 (L) >59 mg/dL   Total CHOL/HDL Ratio 2.6 RATIO   VLDL 10 0 - 40 mg/dL   LDL Cholesterol 50 0 - 99 mg/dL    Comment:        Total Cholesterol/HDL:CHD Risk Coronary Heart Disease Risk Table                     Men   Women  1/2 Average Risk   3.4   3.3  Average Risk       5.0   4.4  2 X Average Risk   9.6   7.1  3 X Average Risk  23.4   11.0        Use the calculated Patient Ratio above and the CHD Risk Table to determine the patient's CHD Risk.        ATP III CLASSIFICATION (LDL):  <100     mg/dL   Optimal  899-870  mg/dL   Near or Above                    Optimal  130-159  mg/dL   Borderline  839-810  mg/dL   High  >809     mg/dL   Very High Performed at [facility], [address], [place], [place] [address]   Hemoglobin A1c     Status: None   Collection Time: 08/05/24  6:41 PM  Result Value Ref Range   Hgb A1c MFr Bld 5.2 4.8 - 5.6 %    Comment: (NOTE) Diagnosis of Diabetes The following HbA1c ranges recommended by the American Diabetes Association (ADA) may be used as an aid in the diagnosis of diabetes mellitus.  Hemoglobin             Suggested A1C NGSP%  Diagnosis  <5.7                   Non Diabetic  5.7-6.4                Pre-Diabetic  >6.4                   Diabetic  <7.0                   Glycemic control for                       adults with diabetes.     Mean Plasma Glucose 102.54 mg/dL    Comment: Performed at [facility], [address] [place], [place] [address]  TSH     Status: None   Collection Time: 08/05/24  6:41 PM  Result Value Ref Range   TSH 3.550 0.350 - 4.500 uIU/mL    Comment: Performed at [facility], [address] [place], [place] [address]   Blood  Alcohol level:  Lab Results  Component Value Date   [facility] <15 08/03/2024   ETH <10 07/23/2022   Metabolic Disorder Labs: Lab Results  Component Value Date   HGBA1C 5.2 08/05/2024   MPG 102.54 08/05/2024   No results found for: PROLACTIN Lab Results  Component Value Date   CHOL 98 08/05/2024   TRIG 51 08/05/2024   HDL 38 (L) 08/05/2024   CHOLHDL 2.6 08/05/2024   VLDL 10 08/05/2024   LDLCALC 50 08/05/2024   Physical Findings: AIMS:  ,  ,  ,  ,  ,  ,   CIWA:    COWS:     Musculoskeletal: Strength & Muscle Tone: within normal limits Gait & Station: normal Patient leans: N/A  Psychiatric Specialty Exam:  Presentation  General Appearance:  Appropriate for Environment; Casual; Fairly Groomed  Eye Contact: Good  Speech: Clear and Coherent  Speech Volume: Normal  Handedness: Right  Mood and Affect  Mood: Euthymic  Affect: Congruent  Thought Process  Thought Processes: Coherent  Descriptions of Associations:Intact  Orientation:Full (Time, Place and Person)  Thought Content:Logical  History of Schizophrenia/Schizoaffective disorder:Yes  Duration of Psychotic Symptoms:N/A  Hallucinations:Hallucinations: None  Ideas of Reference:None  Suicidal Thoughts:Suicidal Thoughts: No  Homicidal Thoughts:Homicidal Thoughts: No  Sensorium  Memory: Immediate Fair; Recent Fair  Judgment: Fair  Insight: Fair  Art Therapist  Concentration: Good  Attention Span: Good  Recall: Good  Fund of Knowledge: Fair  Language: Good  Psychomotor Activity  Psychomotor Activity: Psychomotor Activity: Normal  Assets  Assets: Communication Skills; Desire for Improvement; Physical Health; Social Support; Housing   Sleep  Sleep: Sleep: Good Number of Hours of Sleep: 9.25  Physical Exam: Physical Exam Vitals and nursing note reviewed.  Constitutional:      General: He is not in acute distress.    Appearance: He is not ill-appearing.   HENT:     Head: Normocephalic.     Right Ear: External ear normal.     Left Ear: External ear normal.     Nose: Nose normal.     Mouth/Throat:     Mouth: Mucous membranes are moist.     Pharynx: Oropharynx is clear.  Eyes:     Extraocular Movements: Extraocular movements intact.  Cardiovascular:     Rate and Rhythm: Normal rate.     Pulses: Normal pulses.  Pulmonary:     Effort: Pulmonary effort is  normal.  Musculoskeletal:        General: Normal range of motion.     Cervical back: Normal range of motion.  Skin:    General: Skin is dry.  Neurological:     General: No focal deficit present.     Mental Status: He is alert and oriented to person, place, and time.  Psychiatric:        Mood and Affect: Mood normal.        Behavior: Behavior normal.        Thought Content: Thought content normal.    Review of Systems  Constitutional:  Negative for chills and fever.  HENT:  Negative for sore throat.   Eyes:  Negative for blurred vision.  Respiratory:  Negative for cough, sputum production, shortness of breath and wheezing.   Cardiovascular:  Negative for chest pain and palpitations.  Gastrointestinal:  Negative for abdominal pain, constipation, diarrhea, heartburn, nausea and vomiting.  Genitourinary:  Negative for dysuria.  Musculoskeletal:  Negative for falls.  Skin:  Negative for itching and rash.  Neurological:  Negative for dizziness and headaches.  Endo/Heme/Allergies: Negative.   Psychiatric/Behavioral:  Negative for depression, hallucinations, substance abuse and suicidal ideas. The patient is nervous/anxious. The patient does not have insomnia.    Blood pressure (!) 131/95, pulse 94, temperature 98 F (36.7 C), temperature source Oral, resp. rate 16, height 5' 11 (1.803 m), weight 87.1 kg, SpO2 98%. Body mass index is 26.78 kg/m.  Treatment Plan Summary: Daily contact with patient to assess and evaluate symptoms and progress in treatment and Medication  management   Observation Level/Precautions:  15 minute checks  Laboratory:   CBC: RBC 14.9, neutrophil 12.3, otherwise normal Chemistry Profile: CO2 21 low, anion gap 19 high, total bilirubin 1.3 high, otherwise normal Folic Acid: N/A GGT: N/A TSH: Ordered: 3.550 within normal limits Lipid panel: Ordered: LDL 38, otherwise normal HbAIC:Ordered: 5.2, within normal limits HCG: N/A BAL: Less than 15 Vitamin B-12: N/A   EKG: NSR, ventricular rate 97, QT/QTc 330/419  Psychotherapy: Therapeutic milieu  Medications: See MAR  Consultations: N/A  Discharge Concerns: Safety  Estimated LOS: 3 to 7 days  Other:      Assessment: Jon [facility] is a 23 year old African-American male with prior psychiatric diagnoses significant for major depressive disorder ordered recurrent, severe, with psychosis, schizophreniform disorder, cannabis use, substance-induced mood disorder. Patient presented involuntarily to [facility] from [facility] ED at [facility] for failure to take his psychotropic medications with mental health decompensation.  BAL less than 15, UDS negative for any substances.    Physician Treatment Plan for Primary Diagnosis: MDD (major depressive disorder)   Plans: Medications: --Continue risperidone  1.5 mg tablet p.o. twice daily for psychosis --Continue Cogentin  0.5 mg tablet p.o. twice daily for EPS prevention --Continue hydroxyzine  tablet 25 mg p.o. 3 times daily as needed for anxiety   Continue [facility] agitation protocol as recommended   Medications for other medical conditions: -- Continue Propranolol  tablets 10 mg p.o. twice daily for hypertension -- Continue Protonix  EC tablet 40 mg p.o. daily for GERD   Other PRN Medications  -Acetaminophen  650 mg every 6 as needed/mild pain  -Maalox 30 mL oral every 4 as needed/digestion  -Magnesium  hydroxide 30 mL daily as needed/mild constipation    --The risks/benefits/side-effects/alternatives to this  medication were discussed in detail with the patient and time was given for questions. The patient consents to medication trial.   -- Metabolic profile and EKG monitoring  obtained while on an atypical antipsychotic (BMI: Lipid Panel: HbgA1c: QTc:)   -- Encouraged patient to participate in unit milieu and in scheduled group therapies    Safety and Monitoring:  Voluntary admission to inpatient psychiatric unit for safety, stabilization and treatment  Daily contact with patient to assess and evaluate symptoms and progress in treatment  Patient's case to be discussed in multi-disciplinary team meeting  Observation Level : q15 minute checks  Vital signs: q12 hours  Precautions: suicide, but pt currently verbally contracts for safety on unit?    Discharge Planning:  Social work and case management to assist with discharge planning and identification of Branch follow-up needs prior to discharge  Estimated LOS: 5-7?days  Discharge Concerns: Need to establish Safety plan; Medication compliance and effectiveness  Discharge Goals: Return home with outpatient referrals for mental health follow-up including medication management/psychotherapy.    Long Term Goal(s): Improvement in symptoms so as ready for discharge   Short Term Goals: Ability to identify changes in lifestyle to reduce recurrence of condition will improve, Ability to verbalize feelings will improve, Ability to disclose and discuss suicidal ideas, Ability to demonstrate self-control will improve, Ability to identify and develop effective coping behaviors will improve, Ability to maintain clinical measurements within normal limits will improve, Compliance with prescribed medications will improve, and Ability to identify triggers associated with substance abuse/mental health issues will improve   Physician Treatment Plan for Secondary Diagnosis: Principal Problem:   MDD (major depressive disorder)   I certify that inpatient services  furnished can reasonably be expected to improve the patient's condition.    Eduardo Wurth C Teriann Livingood, FNP 08/06/2024, 5:36 PM

## 2024-08-06 NOTE — Plan of Care (Signed)
  Problem: Education: Goal: Mental status will improve Outcome: Progressing   

## 2024-08-06 NOTE — Group Note (Signed)
 Date:  08/06/2024 Time:  10:00 AM  Group Topic/Focus: Goals group  Patients were given two worksheets: a goals worksheet and a list of 50 positive traits. Patients participated in an icebreaker by sharing their name, a desired Christmas gift, and identifying positive traits they align with. They were encouraged to share their responses and goals with the group. The group focused on promoting expanded thinking, social interaction, and positivity.    Participation Level:  Did Not Attend  Participation Quality:  N/A  Affect:  N/A  Cognitive:  N/A  Insight: None  Engagement in Group:  None  Modes of Intervention:  N/A  Additional Comments:  Pt did not attend goals group.  Kristi HERO Phuong Moffatt 08/06/2024, 10:00 AM

## 2024-08-06 NOTE — Group Note (Signed)
 Date:  08/06/2024 Time:  2:43 PM  Group Topic/Focus: Music therapy  Patients participated in [place], selecting songs of their choice within appropriate therapeutic boundaries. The activity promoted bonding and social connection among participants.    Participation Level:  Did Not Attend  Participation Quality:  N/A  Affect:  N/A  Cognitive:  N/A  Insight: None  Engagement in Group:  None  Modes of Intervention:  N/A  Additional Comments:  Pt did not attend this music therapy group  Kristi HERO [facility] 08/06/2024, 2:43 PM

## 2024-08-07 DIAGNOSIS — F329 Major depressive disorder, single episode, unspecified: Secondary | ICD-10-CM

## 2024-08-07 NOTE — Group Note (Signed)
 Date:  08/07/2024 Time:  8:30 PM  Group Topic/Focus:  Neurotransmitters and their role in moods and behaviors. How psych meds affect them and for which symptoms. The gut flora's role with neurotransmitters.    Participation Level:  Did Not Attend   Jon [facility] 08/07/2024, 8:30 PM

## 2024-08-07 NOTE — Group Note (Signed)
 Date:  08/07/2024 Time:  11:58 AM  Group Topic/Focus:  Group Topic/Focus:  Description: The Social Wellness group focused on identifying and creating healthy personal boundaries. Patients were educated on the purpose of boundaries, different types of boundaries (emotional, physical, verbal), and the importance of boundaries in maintaining healthy relationships and personal well-being. The group discussed examples of healthy versus unhealthy boundaries and practiced assertive communication through discussion and scenario-based examples. Patients were encouraged to reflect on their own boundary challenges and ways to set limits respectfully and safely.  Patient Participation/Response: Patients were attentive and engaged throughout the group. Several patients actively participated by sharing personal examples, writing on the white bored and asking questions. Overall participation was appropriate, and patients demonstrated understanding of the topic through discussion and responses.    Participation Level:  Did Not Attend  Jon [facility] 08/07/2024, 11:58 AM

## 2024-08-07 NOTE — Group Note (Signed)
 Date:  08/07/2024 Time:  9:28 AM  Group Topic/Focus:  Goals Group:   The focus of this group is to help patients establish daily goals to achieve during treatment and discuss how the patient can incorporate goal setting into their daily lives to aide in recovery. Orientation:   The focus of this group is to educate the patient on the purpose and policies of crisis stabilization and provide a format to answer questions about their admission.  The group details unit policies and expectations of patients while admitted.    Participation Level:  Active  Participation Quality:  Appropriate  Affect:  Appropriate  Cognitive:  Appropriate  Insight: Appropriate  Engagement in Group:  Engaged  Modes of Intervention:  Activity  Additional Comments:  n/a  Ryla Cauthon 08/07/2024, 9:28 AM

## 2024-08-07 NOTE — Group Note (Signed)
 Date:  08/07/2024 Time:  9:52 AM  Group Topic/Focus:  RECREATION THERAPY (KEEP IT GOING VOLLYBALL) Purpose: The Keep It Going Volleyball session was organized as a recreational group activity aimed at promoting physical movement, social interaction, and team-building in a supportive and structured environment. The activity serves as a tool to engage participants in an enjoyable and non-competitive setting, encouraging positive mental health through physical exercise, communication, and group participation.  Objectives:  Enhance Socialization: Provide a platform for participants to engage with peers in a relaxed and enjoyable setting, promoting healthy interpersonal interactions.  Promote Physical Activity: Encourage movement and physical exercise, which has been shown to help improve mood, reduce stress, and increase overall well-being.  Build Teamwork and Cooperation: Jerrye a agricultural engineer and cooperation among participants, helping them work together toward a common goal in a non-competitive manner.  Boost Mental Well-Being: Provide a low-pressure environment where participants can engage in fun and rewarding activity, which contributes to reducing anxiety and improving self-esteem.  Increase Engagement: Create an opportunity for participants to be actively engaged and present, reducing feelings of isolation and promoting a sense of belonging.  Activity Overview: The session involved a relaxed, non-competitive game of volleyball where participants worked together in teams. The focus was on having fun, training and development officer, and encouraging active participation. Rules were kept simple to ensure that everyone could take part regardless of skill level.  Facilitators provided gentle guidance and encouragement, ensuring that the activity remained inclusive and supportive. Emphasis was placed on fostering a positive atmosphere, with participants encouraged to celebrate each others efforts and  accomplishments, no matter how small.  Outcome/Results:  Participants showed increased engagement and enthusiasm throughout the activity.  Positive interactions were observed between participants, with moments of laughter, encouragement, and team bonding.  Physical activity appeared to contribute to an improvement in overall mood and energy levels.  Several participants expressed enjoyment and a desire to continue participating in future recreational activities.  Recommendations:  Continue incorporating recreational activities like volleyball into the schedule to promote group cohesion and mental well-being.  Explore further team-based or movement-focused activities to enhance social skills and emotional regulation.    Participation Level:  Did Not Attend   Jon [facility] 08/07/2024, 9:52 AM

## 2024-08-07 NOTE — Plan of Care (Signed)
 Pt was in his room isolating all day. Went to [employer] and ate adequately. Didn't attend groups. Denies SI/HI/SH/paranoia/AVH. Will continue to monitor.

## 2024-08-07 NOTE — Progress Notes (Signed)
 [facility] MD Progress Note  08/07/2024 3:38 PM Jon Branch  MRN:  [id number]  Principal Problem: MDD (major depressive disorder) Diagnosis: Principal Problem:   MDD (major depressive disorder)  Reason for admission: Jon Branch is a 23 year old African-American male with prior psychiatric diagnoses significant for major depressive disorder ordered recurrent, severe, with psychosis, schizophreniform disorder, cannabis use, substance-induced mood disorder. Patient presented involuntarily to [facility] from [facility] health ED at [facility] for failure to take his psychotropic medications with mental health decompensation.  BAL less than 15, UDS negative for any substances.   24-hour chart review: Patient case discussed in interdisciplinary team.  Vital signs reviewed without critical values.  Maalox for indigestion x one required.  No agitation protocol required.  Today's assessment notes: On assessment today, the pt reports that their mood is euthymic, improved since admission, and stable. Denies feeling down, depressed, or sad. Rates both depression and anxiety as numbers 0/10, with 10 being more severe.  He presents alert, cooperative, pleasant with smiles, and oriented to person, time, place, and situation.  Patient reports to this provider that he will return home with his parents after discharge from the hospital.  Compliant with psychotropic medications and denies having side effects to current psychiatric medications. No EPS, cogwheel rigidity or TD observed during today's assessment. Added that he will eat with the parents as a family as requested by his mother. However, he will reduce his portion of food to maintain his weight and not get obese. Reports that anxiety symptoms are at manageable level. Sleep is stable. Appetite is stable. Concentration is without complaint. Energy level is adequate. Objectively not responding to internal or external stimuli. Thought process  coherent, organized, and logical.  Thought content without delusional thinking or paranoia. Denies having any suicidal thoughts. Denies having any suicidal intent and plan. Denies having any HI. Denies having psychotic symptoms. Denies having side effects to current psychiatric medications.   Collateral information: Patient's father Jon Branch, called at [phone] for more information and to update on patient's status per patient's permission.  Father reports that patient has spoken to him, and he feels there is much improvement from when the patient was admitted to the hospital.  Reports to the patient's father that patient is bathing, eating well, getting along with peers at the unit, and taking his medications.  Made aware patient's psychotic symptoms are stable enough to be discharged from the hospital on Sunday, 08/09/2024.  Patient father was impressed and thanked this provider.  Stated, I will be there between 11:00 AM and 11:30 AM to pick patient up to home.  Total Time spent with patient: 45 minutes  Past Psychiatric History: Previous Psych Diagnoses: Major depressive disorder, recurrent, with psychotic features Prior inpatient treatment: Patient report yes 2 years ago, however does not know where he was admitted. Current/prior outpatient treatment: Denies Prior rehab hx: Denies Psychotherapy hx: Yes History of suicide: Denies History of homicide or aggression: Denies Psychiatric medication history: Denies Psychiatric medication compliance history: Noncompliance Neuromodulation history: Denies Current Psychiatrist: Reports he has a psychiatrist but does not know who Current therapist: Denies   Past Medical History:  Past Medical History:  Diagnosis Date   GERD (gastroesophageal reflux disease)    Hypertension     Past Surgical History:  Procedure Laterality Date   EYE SURGERY     Family History:  Family History  Problem Relation Age of Onset   High blood pressure  Mother    Diabetes Mother  High blood pressure Father    High blood pressure Maternal Grandmother    Diabetes Maternal Grandmother    High blood pressure Paternal Grandmother    Colon cancer Neg Hx    Family Psychiatric  History: See H&P Social History:  Social History   Substance and Sexual Activity  Alcohol Use Never     Social History   Substance and Sexual Activity  Drug Use Not Currently   Types: Marijuana    Social History   Socioeconomic History   Marital status: Single    Spouse name: Not on file   Number of children: Not on file   Years of education: Not on file   Highest education level: Not on file  Occupational History   Not on file  Tobacco Use   Smoking status: Former    Types: Cigarettes   Smokeless tobacco: Never  Vaping Use   Vaping status: Former   Substances: Flavoring  Substance and Sexual Activity   Alcohol use: Never   Drug use: Not Currently    Types: Marijuana   Sexual activity: Not Currently  Other Topics Concern   Not on file  Social History Narrative   ** Merged History Encounter **       Social Drivers of Health   Tobacco Use: Medium Risk (08/04/2024)   Patient History    Smoking Tobacco Use: Former    Smokeless Tobacco Use: Never    Passive Exposure: Not on Actuary Strain: Not on file  Food Insecurity: No Food Insecurity (08/04/2024)   Epic    Worried About Programme Researcher, Broadcasting/film/video in the Last Year: Never true    Ran Out of Food in the Last Year: Never true  Transportation Needs: No Transportation Needs (08/04/2024)   Epic    Lack of Transportation (Medical): No    Lack of Transportation (Non-Medical): No  Physical Activity: Not on file  Stress: Not on file  Social Connections: Not on file  Depression (EYV7-0): Not on file  Alcohol Screen: Low Risk (08/04/2024)   Alcohol Screen    Last Alcohol Screening Score (AUDIT): 0  Housing: Low Risk (08/04/2024)   Epic    Unable to Pay for Housing in the Last  Year: No    Number of Times Moved in the Last Year: 0    Homeless in the Last Year: No  Utilities: Not At Risk (08/04/2024)   Epic    Threatened with loss of utilities: No  Health Literacy: Not on file   Additional Social History:    Sleep: Good Estimated Sleeping Duration (Last 24 Hours): 5.00-6.00 hours  Appetite:  Good  Current Medications: Current Facility-Administered Medications  Medication Dose Route Frequency Provider Last Rate Last Admin   acetaminophen  (TYLENOL ) tablet 650 mg  650 mg Oral Q6H PRN Motley-Mangrum, Jadeka A, PMHNP       alum & mag hydroxide-simeth (MAALOX/MYLANTA) 200-200-20 MG/5ML suspension 30 mL  30 mL Oral Q4H PRN Motley-Mangrum, Jadeka A, PMHNP   30 mL at 08/06/24 2127   benztropine  (COGENTIN ) tablet 0.5 mg  0.5 mg Oral BID Motley-Mangrum, Jadeka A, PMHNP   0.5 mg at 08/07/24 9185   haloperidol  (HALDOL ) tablet 5 mg  5 mg Oral TID PRN Motley-Mangrum, Jadeka A, PMHNP       And   diphenhydrAMINE  (BENADRYL ) capsule 50 mg  50 mg Oral TID PRN Motley-Mangrum, Jadeka A, PMHNP       haloperidol  lactate (HALDOL ) injection 5 mg  5 mg Intramuscular  TID PRN Motley-Mangrum, Jadeka A, PMHNP       And   diphenhydrAMINE  (BENADRYL ) injection 50 mg  50 mg Intramuscular TID PRN Motley-Mangrum, Jadeka A, PMHNP       And   LORazepam  (ATIVAN ) injection 2 mg  2 mg Intramuscular TID PRN Motley-Mangrum, Jadeka A, PMHNP       haloperidol  lactate (HALDOL ) injection 10 mg  10 mg Intramuscular TID PRN Motley-Mangrum, Jadeka A, PMHNP       And   diphenhydrAMINE  (BENADRYL ) injection 50 mg  50 mg Intramuscular TID PRN Motley-Mangrum, Jadeka A, PMHNP       And   LORazepam  (ATIVAN ) injection 2 mg  2 mg Intramuscular TID PRN Motley-Mangrum, Jadeka A, PMHNP       hydrOXYzine  (ATARAX ) tablet 25 mg  25 mg Oral TID PRN Motley-Mangrum, Jadeka A, PMHNP       magnesium  hydroxide (MILK OF MAGNESIA) suspension 30 mL  30 mL Oral Daily PRN Motley-Mangrum, Jadeka A, PMHNP       pantoprazole   (PROTONIX ) EC tablet 40 mg  40 mg Oral Daily Dagny Fiorentino C, FNP   40 mg at 08/06/24 0845   propranolol  (INDERAL ) tablet 10 mg  10 mg Oral BID Njeri Vicente C, FNP   10 mg at 08/07/24 9186   risperiDONE  (RISPERDAL ) tablet 1.5 mg  1.5 mg Oral BID Motley-Mangrum, Jadeka A, PMHNP   1.5 mg at 08/07/24 9185   Lab Results:  Results for orders placed or performed during the hospital encounter of 08/04/24 (from the past 48 hours)  Lipid panel     Status: Abnormal   Collection Time: 08/05/24  6:41 PM  Result Value Ref Range   Cholesterol 98 0 - 200 mg/dL    Comment:        ATP III CLASSIFICATION:  <200     mg/dL   Desirable  799-760  mg/dL   Borderline High  >=759    mg/dL   High           Triglycerides 51 <150 mg/dL   HDL 38 (L) >59 mg/dL   Total CHOL/HDL Ratio 2.6 RATIO   VLDL 10 0 - 40 mg/dL   LDL Cholesterol 50 0 - 99 mg/dL    Comment:        Total Cholesterol/HDL:CHD Risk Coronary Heart Disease Risk Table                     Men   Women  1/2 Average Risk   3.4   3.3  Average Risk       5.0   4.4  2 X Average Risk   9.6   7.1  3 X Average Risk  23.4   11.0        Use the calculated Patient Ratio above and the CHD Risk Table to determine the patient's CHD Risk.        ATP III CLASSIFICATION (LDL):  <100     mg/dL   Optimal  899-870  mg/dL   Near or Above                    Optimal  130-159  mg/dL   Borderline  839-810  mg/dL   High  >809     mg/dL   Very High Performed at [facility], [address] [place], [place] [address]   Hemoglobin A1c     Status: None   Collection Time: 08/05/24  6:41 PM  Result Value Ref Range   Hgb A1c MFr Bld 5.2 4.8 - 5.6 %    Comment: (NOTE) Diagnosis of Diabetes The following HbA1c ranges recommended by the American Diabetes Association (ADA) may be used as an aid in the diagnosis of diabetes mellitus.  Hemoglobin             Suggested A1C NGSP%              Diagnosis  <5.7                   Non Diabetic  5.7-6.4                 Pre-Diabetic  >6.4                   Diabetic  <7.0                   Glycemic control for                       adults with diabetes.     Mean Plasma Glucose 102.54 mg/dL    Comment: Performed at [facility], [address] [place], [place] [address]  TSH     Status: None   Collection Time: 08/05/24  6:41 PM  Result Value Ref Range   TSH 3.550 0.350 - 4.500 uIU/mL    Comment: Performed at [facility], [address], [place], [place] [address]   Blood Alcohol level:  Lab Results  Component Value Date   [facility] <15 08/03/2024   ETH <10 07/23/2022   Metabolic Disorder Labs: Lab Results  Component Value Date   HGBA1C 5.2 08/05/2024   MPG 102.54 08/05/2024   No results found for: PROLACTIN Lab Results  Component Value Date   CHOL 98 08/05/2024   TRIG 51 08/05/2024   HDL 38 (L) 08/05/2024   CHOLHDL 2.6 08/05/2024   VLDL 10 08/05/2024   LDLCALC 50 08/05/2024   Physical Findings: AIMS:  ,  ,  ,  ,  ,  ,   CIWA:    COWS:     Musculoskeletal: Strength & Muscle Tone: within normal limits Gait & Station: normal Patient leans: N/A  Psychiatric Specialty Exam:  Presentation  General Appearance:  Appropriate for Environment; Casual; Fairly Groomed  Eye Contact: Good  Speech: Clear and Coherent  Speech Volume: Normal  Handedness: Right  Mood and Affect  Mood: Euthymic  Affect: Congruent  Thought Process  Thought Processes: Coherent; Goal Directed  Descriptions of Associations:Intact  Orientation:Full (Time, Place and Person)  Thought Content:Logical  History of Schizophrenia/Schizoaffective disorder:Yes  Duration of Psychotic Symptoms:Less than six months  Hallucinations:Hallucinations: None  Ideas of Reference:None  Suicidal Thoughts:Suicidal Thoughts: No  Homicidal Thoughts:Homicidal Thoughts: No  Sensorium  Memory: Immediate Good; Recent Good  Judgment: Fair  Insight: Fair  Restaurant Manager, Fast Food  Concentration: Good  Attention Span: FairMERL Branch  Recall: Good  Fund of Knowledge: Good  Language: Good  Psychomotor Activity  Psychomotor Activity: Psychomotor Activity: Normal  Assets  Assets: Communication Skills; Desire for Improvement; Housing; Leisure Time; Physical Health; Resilience; Social Support  Sleep  Sleep: Sleep: Good Number of Hours of Sleep: 7  Physical Exam: Physical Exam Vitals and nursing note reviewed.  Constitutional:      General: He is not in acute distress.    Appearance: He is not ill-appearing.  HENT:     Head: Normocephalic.  Right Ear: External ear normal.     Left Ear: External ear normal.     Nose: Nose normal.     Mouth/Throat:     Mouth: Mucous membranes are moist.     Pharynx: Oropharynx is clear.  Eyes:     Extraocular Movements: Extraocular movements intact.  Cardiovascular:     Rate and Rhythm: Normal rate.     Pulses: Normal pulses.  Pulmonary:     Effort: Pulmonary effort is normal.  Musculoskeletal:        General: Normal range of motion.     Cervical back: Normal range of motion.  Skin:    General: Skin is dry.  Neurological:     General: No focal deficit present.     Mental Status: He is alert and oriented to person, place, and time.  Psychiatric:        Mood and Affect: Mood normal.        Behavior: Behavior normal.        Thought Content: Thought content normal.    Review of Systems  Constitutional:  Negative for chills and fever.  HENT:  Negative for sore throat.   Eyes:  Negative for blurred vision.  Respiratory:  Negative for cough, sputum production, shortness of breath and wheezing.   Cardiovascular:  Negative for chest pain and palpitations.  Gastrointestinal:  Negative for abdominal pain, constipation, diarrhea, heartburn, nausea and vomiting.  Genitourinary:  Negative for dysuria.  Musculoskeletal:  Negative for falls.  Skin:  Negative for itching and rash.  Neurological:   Negative for dizziness and headaches.  Endo/Heme/Allergies: Negative.   Psychiatric/Behavioral:  Negative for depression, hallucinations, substance abuse and suicidal ideas. The patient is nervous/anxious. The patient does not have insomnia.    Blood pressure (!) 128/94, pulse 98, temperature 97.8 F (36.6 C), temperature source Oral, resp. rate 16, height 5' 11 (1.803 m), weight 87.1 kg, SpO2 99%. Body mass index is 26.78 kg/m.  Treatment Plan Summary: Daily contact with patient to assess and evaluate symptoms and progress in treatment and Medication management   Observation Level/Precautions:  15 minute checks  Laboratory:   CBC: RBC 14.9, neutrophil 12.3, otherwise normal Chemistry Profile: CO2 21 low, anion gap 19 high, total bilirubin 1.3 high, otherwise normal Folic Acid: N/A GGT: N/A TSH: Ordered: 3.550 within normal limits Lipid panel: Ordered: LDL 38, otherwise normal HbAIC:Ordered: 5.2, within normal limits HCG: N/A BAL: Less than 15 Vitamin B-12: N/A   EKG: NSR, ventricular rate 97, QT/QTc 330/419  Psychotherapy: Therapeutic milieu  Medications: See MAR  Consultations: N/A  Discharge Concerns: Safety  Estimated LOS: 3 to 7 days  Other:      Assessment: Jon Branch is a 23 year old African-American male with prior psychiatric diagnoses significant for major depressive disorder ordered recurrent, severe, with psychosis, schizophreniform disorder, cannabis use, substance-induced mood disorder. Patient presented involuntarily to [facility] from [facility] health ED at [facility] for failure to take his psychotropic medications with mental health decompensation.  BAL less than 15, UDS negative for any substances.    Physician Treatment Plan for Primary Diagnosis: MDD (major depressive disorder)   Plans: Medications: --Continue risperidone  1.5 mg tablet p.o. twice daily for psychosis --Continue Cogentin  0.5 mg tablet p.o. twice daily for EPS  prevention --Continue hydroxyzine  tablet 25 mg p.o. 3 times daily as needed for anxiety   Continue [facility] agitation protocol as recommended   Medications for other medical conditions: -- Continue Propranolol  tablets 10 mg p.o.  twice daily for hypertension -- Continue Protonix  EC tablet 40 mg p.o. daily for GERD   Other PRN Medications  -Acetaminophen  650 mg every 6 as needed/mild pain  -Maalox 30 mL oral every 4 as needed/digestion  -Magnesium  hydroxide 30 mL daily as needed/mild constipation    --The risks/benefits/side-effects/alternatives to this medication were discussed in detail with the patient and time was given for questions. The patient consents to medication trial.   -- Metabolic profile and EKG monitoring obtained while on an atypical antipsychotic (BMI: Lipid Panel: HbgA1c: QTc:)   -- Encouraged patient to participate in unit milieu and in scheduled group therapies    Safety and Monitoring:  Voluntary admission to inpatient psychiatric unit for safety, stabilization and treatment  Daily contact with patient to assess and evaluate symptoms and progress in treatment  Patient's case to be discussed in multi-disciplinary team meeting  Observation Level : q15 minute checks  Vital signs: q12 hours  Precautions: suicide, but pt currently verbally contracts for safety on unit?    Discharge Planning:  Social work and case management to assist with discharge planning and identification of hospital follow-up needs prior to discharge  Estimated LOS: 5-7?days  Discharge Concerns: Need to establish Safety plan; Medication compliance and effectiveness  Discharge Goals: Return home with outpatient referrals for mental health follow-up including medication management/psychotherapy.    Long Term Goal(s): Improvement in symptoms so as ready for discharge   Short Term Goals: Ability to identify changes in lifestyle to reduce recurrence of condition will improve, Ability to verbalize feelings  will improve, Ability to disclose and discuss suicidal ideas, Ability to demonstrate self-control will improve, Ability to identify and develop effective coping behaviors will improve, Ability to maintain clinical measurements within normal limits will improve, Compliance with prescribed medications will improve, and Ability to identify triggers associated with substance abuse/mental health issues will improve   Physician Treatment Plan for Secondary Diagnosis: Principal Problem:   MDD (major depressive disorder)   I certify that inpatient services furnished can reasonably be expected to improve the patient's condition.    Ellouise JAYSON Azure, FNP 08/07/2024, 3:38 PM Patient ID: Jon Branch, male   DOB: [birth year], 23 y.o.   MRN: [id number]

## 2024-08-07 NOTE — Plan of Care (Signed)
   Problem: Education: Goal: Knowledge of [place] [facility] Education information/materials will improve Outcome: Progressing Goal: Emotional status will improve Outcome: Progressing Goal: Mental status will improve Outcome: Progressing

## 2024-08-07 NOTE — Group Note (Signed)
 Recreation Therapy Group Note   Group Topic:Leisure Education  Group Date: 08/07/2024 Start Time: 0930 End Time: 1000 Facilitators: Jarae Nemmers-McCall, LRT,CTRS Location: [address] Dayroom   Group Topic: Leisure Education   Goal Area(s) Addresses:  Patient will successfully identify positive leisure and recreation activities.  Patient will acknowledge benefits of participation in healthy leisure activities post discharge.  Patient will actively work with peers toward a shared goal.   Behavioral Response:    Intervention: Cooperative Group Game    Activity: Keep It Contractor. In circle, patients were timed as they tossed a beach ball to each to each other to determine how long they could keep the ball in motion. If the ball came to a complete stop, the time would reset and the game started over. Patients were trying to beat the longest time of previous groups.    Education: Teacher, English As A Foreign Language, Leisure as Merchant [employer] Officer, Programmer, Applications, Building Control Surveyor   Education Outcome: Acknowledges education/In group clarification offered/Needs additional education   Affect/Mood: N/A   Participation Level: Did not attend    Clinical Observations/Individualized Feedback:      Plan: Continue to engage patient in RT group sessions 2-3x/week.   Feliberto Stockley-McCall, LRT,CTRS 08/07/2024 12:14 PM

## 2024-08-07 NOTE — [facility] Suicide Risk Assessment (Signed)
 [facility] INPATIENT:  Family/Significant Other Suicide Prevention Education  Suicide Prevention Education:  Contact Attempts: Jon Branch, dad, [phone], (name of family member/significant other) has been identified by the patient as the family member/significant other with whom the patient will be residing, and identified as the person(s) who will aid the patient in the event of a mental health crisis.  With written consent from the patient, two attempts were made to provide suicide prevention education, prior to and/or following the patient's discharge.  We were unsuccessful in providing suicide prevention education.  A suicide education pamphlet was given to the patient to share with family/significant other.  Date and time of second attempt: 08/07/24 @ 1007 AM, voicemail box full unable to leave vm  Jon Branch 08/07/2024, 10:08 AM

## 2024-08-07 NOTE — Progress Notes (Signed)
(  Sleep Hours) - (Any PRNs that were needed, meds refused, or side effects to meds)- none (Any disturbances and when (visitation, over night)- none (Concerns raised by the patient)- none (SI/HI/AVH)- denies

## 2024-08-07 NOTE — [facility] Suicide Risk Assessment (Signed)
 [facility] INPATIENT:  Family/Significant Other Suicide Prevention Education  Suicide Prevention Education:  Education Completed;  Meilech Virts, dad, [phone],  (name of family member/significant other) has been identified by the patient as the family member/significant other with whom the patient will be residing, and identified as the person(s) who will aid the patient in the event of a mental health crisis (suicidal ideations/suicide attempt).  With written consent from the patient, the family member/significant other has been provided the following suicide prevention education, prior to the and/or following the discharge of the patient.  Completed safety planning via Arabic interpreter [phone].  Reports MD called and spoke with him already and they decided on discharge at 11:30 AM on Sunday 12/21. No safety concerns with pt returning home at discharge. There are no weapons or firearms in the home.   The suicide prevention education provided includes the following: Suicide risk factors Suicide prevention and interventions National Suicide Hotline telephone number [facility] assessment telephone number [facility] Emergency Assistance 911 [facility] and/or Residential Mobile Crisis Unit telephone number  Request made of family/significant other to: Remove weapons (e.g., guns, rifles, knives), all items previously/currently identified as safety concern.   Remove drugs/medications (over-the-counter, prescriptions, illicit drugs), all items previously/currently identified as a safety concern.  The family member/significant other verbalizes understanding of the suicide prevention education information provided.  The family member/significant other agrees to remove the items of safety concern listed above.  Jenkins LULLA Primer 08/07/2024, 2:46 PM

## 2024-08-08 NOTE — Group Note (Deleted)
 Date:  08/08/2024 Time:  8:44 PM  Group Topic/Focus:  Self Esteem Action Plan:   The focus of this group is to help patients create a plan to continue to build self-esteem after discharge.     Participation Level:  {B[facility] PARTICIPATION OZCZO:77735}  Participation Quality:  {B[facility] PARTICIPATION QUALITY:22265}  Affect:  {B[facility] AFFECT:22266}  Cognitive:  {[facility] COGNITIVE:22267}  Insight: {BHH Insight2:20797}  Engagement in Group:  {B[facility] ENGAGEMENT IN HMNLE:77731}  Modes of Intervention:  {B[facility] MODES OF INTERVENTION:22269}  Additional Comments:  ***  Jon Branch 08/08/2024, 8:44 PM

## 2024-08-08 NOTE — [facility] Group Notes (Signed)
 Pt did not attend the CSW group today, 08/08/24 (1000-1100)

## 2024-08-08 NOTE — Group Note (Signed)
 Date:  08/08/2024 Time:  5:23 PM    Group Topic/Focus:  Dimensions of Wellness:   The focus of this group is to introduce the topic of wellness using collage. Patients created intuitive collages: serving as a creative outlet for expressing emotions, reducing anxiety, fostering group cohesion and enabling personal insight and healing.   Participation Level:  Did Not Attend   Berwyn GORMAN Acosta 08/08/2024, 5:23 PM

## 2024-08-08 NOTE — Progress Notes (Signed)
" °   08/08/24 1800  Psych Admission Type (Psych Patients Only)  Admission Status Involuntary  Psychosocial Assessment  Patient Complaints None  Eye Contact Fair  Facial Expression Flat  Affect Flat  Speech Logical/coherent  Interaction Isolative  Motor Activity Slow  Appearance/Hygiene Unremarkable  Behavior Characteristics Cooperative  Mood Pleasant  Thought Process  Coherency WDL  Content WDL  Delusions None reported or observed  Perception WDL  Hallucination None reported or observed  Judgment Impaired  Confusion None  Danger to Self  Current suicidal ideation? Denies  Agreement Not to Harm Self Yes  Description of Agreement Verbal  Danger to Others  Danger to Others None reported or observed    "

## 2024-08-08 NOTE — Plan of Care (Signed)
  Problem: Education: Goal: Mental status will improve Outcome: Progressing   

## 2024-08-08 NOTE — [facility] Group Notes (Signed)
 LCSW Group Therapy Note  08/08/2024   10:00am-11:00am  Type of Therapy and Topic:  Group Therapy: Gratitude  Participation Level:  Did Not Attend   Description of Group:   In this group, patients shared and discussed the importance of acknowledging the elements in their lives for which they are grateful and how this can positively impact their mood.  The group discussed how bringing the positive elements of their lives to the forefront of their minds can help with recovery from any illness, physical or mental.  An exercise was done as a group in which a list was made of gratitude items in order to encourage participants to consider other potential positives in their lives.  Therapeutic Goals: Patients will discuss quotes about gratitude and explore how a change of attitude can make life more joyful. Patients will identify one or more items for which they are grateful in each of 6 categories:  people, experiences, things, places, skills, and other. Patients will discuss how it is possible to seek out gratitude in even bad situations. Patients will explore how the lack of gratitude can bring them down.   Summary of Patient Progress:  Patient was invited to group, did not attend.   Therapeutic Modalities:   Solution-Focused Therapy Activity  Mishika Flippen J Grossman-Orr, LCSW .

## 2024-08-08 NOTE — Group Note (Signed)
 Date:  08/08/2024 Time:  10:23 AM  Group Topic/Focus: Goals group  Patients began by introducing themselves and sharing their favorite food as an research scientist (life sciences) activity. After that, they were given SMART goal worksheets to complete. Examples of SMART goals were provided, and the patients had time to fill out their worksheets. Once finished, each patient shared their icebreaker responses and SMART goals with the group, encouraging social interaction and goal-setting.   Participation Level:  Did Not Attend  Participation Quality:  N/A  Affect:  N/A  Cognitive:  N/A  Insight: None  Engagement in Group:  None  Modes of Intervention:  N/A  Additional Comments:  Pt did not attend goals group  Kristi HERO [facility] 08/08/2024, 10:23 AM

## 2024-08-08 NOTE — Group Note (Signed)
 Date:  08/08/2024 Time:  4:31 PM  Group Topic/Focus: Karaoke and bingo  Patients participated in a 20-minute karaoke session, which provided an opportunity for them to express their creativity, build confidence, and offer support to each other. Following the karaoke, the group spent the remainder of the hour playing bingo with this MHT. The activity fostered engagement and social interaction among the patients. Winners of [employer] game were rewarded with an extra snack as a positive reinforcement.    Participation Level:  Did Not Attend  Participation Quality:  N/A  Affect:  N/A  Cognitive:  N/A  Insight: None  Engagement in Group:  None  Modes of Intervention:  N/A  Additional Comments:  Pt did not attend this group  Kristi HERO [facility] 08/08/2024, 4:31 PM

## 2024-08-08 NOTE — Group Note (Signed)
 Date:  08/08/2024 Time:  11:59 AM  Group Topic/Focus: Physical wellness  This physical wellness group focused on Zumba and music-based movement using YouTube videos. Patients were encouraged to engage in dancing with peers as a fun, accessible way to increase physical activity and promote circulation as an alternative to gym-based exercise.    Participation Level:  Did Not Attend  Participation Quality:  N/A  Affect:  N/A  Cognitive:  N/A  Insight: None  Engagement in Group:  None  Modes of Intervention:  N/A  Additional Comments:  Pt did not attend this group  Kristi HERO [facility] 08/08/2024, 11:59 AM

## 2024-08-08 NOTE — Progress Notes (Signed)
 [facility] MD Progress Note  08/08/2024 2:15 PM Jon Branch  MRN:  [id number] Subjective:   Jon Branch is a 23 yr old male with prior psychiatric diagnoses significant for major depressive disorder ordered recurrent, severe, with psychosis, schizophreniform disorder, cannabis use, substance-induced mood disorder. Patient presented involuntarily to [facility] from [facility] health ED at [facility] for failure to take his psychotropic medications with mental health decompensation.   Case was discussed in the multidisciplinary team. MAR was reviewed and patient was compliant with medications.  He did not receive any PRN medications yesterday.   Psychiatric Team made the following recommendations yesterday: --Continue risperidone  1.5 mg tablet p.o. twice daily for psychosis --Continue Cogentin  0.5 mg tablet p.o. twice daily for EPS prevention    On interview today patient reports he slept good last night.  He reports his appetite is doing good.  He reports no SI, HI, or AVH.  He reports no Paranoia or Ideas of Reference.  He reports no issues with his medications.  He reports that his medications have been helpful.  Discussed with him that we will plan for discharge tomorrow and he reports looking forward to this.  He reports no other concerns at present.  Principal Problem: MDD (major depressive disorder), recurrent, severe, with psychosis (HCC) Diagnosis: Principal Problem:   MDD (major depressive disorder), recurrent, severe, with psychosis (HCC)  Total Time spent with patient:  I personally spent 35 minutes on the unit in direct patient care. The direct patient care time included face-to-face time with the patient, reviewing the patient's chart, communicating with other professionals, and coordinating care.    Past Psychiatric History:  Major depressive disorder, recurrent, with psychotic features Prior inpatient treatment: Patient report yes 2 years ago,  however does not know where he was admitted. Current/prior outpatient treatment: Denies Prior rehab hx: Denies Psychotherapy hx: Yes History of suicide: Denies History of homicide or aggression: Denies Psychiatric medication history: Denies Psychiatric medication compliance history: Noncompliance Neuromodulation history: Denies Current Psychiatrist: Reports he Branch a psychiatrist but does not know who Current therapist: Denies  Past Medical History:  Past Medical History:  Diagnosis Date   GERD (gastroesophageal reflux disease)    Hypertension     Past Surgical History:  Procedure Laterality Date   EYE SURGERY     Family History:  Family History  Problem Relation Age of Onset   High blood pressure Mother    Diabetes Mother    High blood pressure Father    High blood pressure Maternal Grandmother    Diabetes Maternal Grandmother    High blood pressure Paternal Grandmother    Colon cancer Neg Hx    Family Psychiatric  History:  Psych: Patient unsure Psych Rx: Patient unsure SA/HA: Patient unsure Substance use family hx: Patient unsure  Social History:  Social History   Substance and Sexual Activity  Alcohol Use Never     Social History   Substance and Sexual Activity  Drug Use Not Currently   Types: Marijuana    Social History   Socioeconomic History   Marital status: Single    Spouse name: Not on file   Number of children: Not on file   Years of education: Not on file   Highest education level: Not on file  Occupational History   Not on file  Tobacco Use   Smoking status: Former    Types: Cigarettes   Smokeless tobacco: Never  Vaping Use   Vaping status: Former  Substances: Flavoring  Substance and Sexual Activity   Alcohol use: Never   Drug use: Not Currently    Types: Marijuana   Sexual activity: Not Currently  Other Topics Concern   Not on file  Social History Narrative   ** Merged History Encounter **       Social Drivers of Health    Tobacco Use: Medium Risk (08/04/2024)   Patient History    Smoking Tobacco Use: Former    Smokeless Tobacco Use: Never    Passive Exposure: Not on Actuary Strain: Not on file  Food Insecurity: No Food Insecurity (08/04/2024)   Epic    Worried About Programme Researcher, Broadcasting/film/video in the Last Year: Never true    Ran Out of Food in the Last Year: Never true  Transportation Needs: No Transportation Needs (08/04/2024)   Epic    Lack of Transportation (Medical): No    Lack of Transportation (Non-Medical): No  Physical Activity: Not on file  Stress: Not on file  Social Connections: Not on file  Depression (EYV7-0): Not on file  Alcohol Screen: Low Risk (08/04/2024)   Alcohol Screen    Last Alcohol Screening Score (AUDIT): 0  Housing: Low Risk (08/04/2024)   Epic    Unable to Pay for Housing in the Last Year: No    Number of Times Moved in the Last Year: 0    Homeless in the Last Year: No  Utilities: Not At Risk (08/04/2024)   Epic    Threatened with loss of utilities: No  Health Literacy: Not on file   Additional Social History:                         Sleep: Good Estimated Sleeping Duration (Last 24 Hours): 8.00-9.75 hours  Appetite:  Good  Current Medications: Current Facility-Administered Medications  Medication Dose Route Frequency Provider Last Rate Last Admin   acetaminophen  (TYLENOL ) tablet 650 mg  650 mg Oral Q6H PRN Motley-Mangrum, Jadeka A, PMHNP       alum & mag hydroxide-simeth (MAALOX/MYLANTA) 200-200-20 MG/5ML suspension 30 mL  30 mL Oral Q4H PRN Motley-Mangrum, Jadeka A, PMHNP   30 mL at 08/06/24 2127   benztropine  (COGENTIN ) tablet 0.5 mg  0.5 mg Oral BID Motley-Mangrum, Jadeka A, PMHNP   0.5 mg at 08/08/24 0844   haloperidol  (HALDOL ) tablet 5 mg  5 mg Oral TID PRN Motley-Mangrum, Jadeka A, PMHNP       And   diphenhydrAMINE  (BENADRYL ) capsule 50 mg  50 mg Oral TID PRN Motley-Mangrum, Jadeka A, PMHNP       haloperidol  lactate (HALDOL )  injection 5 mg  5 mg Intramuscular TID PRN Motley-Mangrum, Jadeka A, PMHNP       And   diphenhydrAMINE  (BENADRYL ) injection 50 mg  50 mg Intramuscular TID PRN Motley-Mangrum, Jadeka A, PMHNP       And   LORazepam  (ATIVAN ) injection 2 mg  2 mg Intramuscular TID PRN Motley-Mangrum, Jadeka A, PMHNP       haloperidol  lactate (HALDOL ) injection 10 mg  10 mg Intramuscular TID PRN Motley-Mangrum, Jadeka A, PMHNP       And   diphenhydrAMINE  (BENADRYL ) injection 50 mg  50 mg Intramuscular TID PRN Motley-Mangrum, Jadeka A, PMHNP       And   LORazepam  (ATIVAN ) injection 2 mg  2 mg Intramuscular TID PRN Motley-Mangrum, Jadeka A, PMHNP       hydrOXYzine  (ATARAX ) tablet 25 mg  25 mg  Oral TID PRN Motley-Mangrum, Jadeka A, PMHNP       magnesium  hydroxide (MILK OF MAGNESIA) suspension 30 mL  30 mL Oral Daily PRN Motley-Mangrum, Jadeka A, PMHNP       pantoprazole  (PROTONIX ) EC tablet 40 mg  40 mg Oral Daily Ntuen, Tina C, FNP   40 mg at 08/08/24 0843   propranolol  (INDERAL ) tablet 10 mg  10 mg Oral BID Ntuen, Tina C, FNP   10 mg at 08/08/24 9156   risperiDONE  (RISPERDAL ) tablet 1.5 mg  1.5 mg Oral BID Motley-Mangrum, Jadeka A, PMHNP   1.5 mg at 08/08/24 9156    Lab Results: No results found for this or any previous visit (from the past 48 hours).  Blood Alcohol level:  Lab Results  Component Value Date   [facility] <15 08/03/2024   ETH <10 07/23/2022    Metabolic Disorder Labs: Lab Results  Component Value Date   HGBA1C 5.2 08/05/2024   MPG 102.54 08/05/2024   No results found for: PROLACTIN Lab Results  Component Value Date   CHOL 98 08/05/2024   TRIG 51 08/05/2024   HDL 38 (L) 08/05/2024   CHOLHDL 2.6 08/05/2024   VLDL 10 08/05/2024   LDLCALC 50 08/05/2024    Physical Findings: AIMS:  ,  ,  ,  ,  ,  ,   CIWA:    COWS:     Musculoskeletal: Strength & Muscle Tone: within normal limits Gait & Station: normal Patient leans: N/A  Psychiatric Specialty Exam:  Presentation  General  Appearance:  Appropriate for Environment; Casual  Eye Contact: Good  Speech: Clear and Coherent; Normal Rate  Speech Volume: Normal  Handedness: Right   Mood and Affect  Mood: Euthymic  Affect: Appropriate; Congruent   Thought Process  Thought Processes: Coherent; Goal Directed  Descriptions of Associations:Intact  Orientation:Full (Time, Place and Person)  Thought Content:Logical; WDL  History of Schizophrenia/Schizoaffective disorder:Yes  Duration of Psychotic Symptoms:Less than six months  Hallucinations:Hallucinations: None  Ideas of Reference:None  Suicidal Thoughts:Suicidal Thoughts: No  Homicidal Thoughts:Homicidal Thoughts: No   Sensorium  Memory: Immediate Good; Recent Good  Judgment: Fair  Insight: Fair   Art Therapist  Concentration: Good  Attention Span: Good  Recall: Good  Fund of Knowledge: Good  Language: Good   Psychomotor Activity  Psychomotor Activity: Psychomotor Activity: Normal   Assets  Assets: Communication Skills; Desire for Improvement; Housing; Physical Health; Resilience; Social Support   Sleep  Sleep: Sleep: Good Number of Hours of Sleep: 7    Physical Exam: Physical Exam Vitals and nursing note reviewed.  Constitutional:      General: He is not in acute distress.    Appearance: Normal appearance. He is normal weight. He is not ill-appearing or toxic-appearing.  HENT:     Head: Normocephalic and atraumatic.  Pulmonary:     Effort: Pulmonary effort is normal.  Musculoskeletal:        General: Normal range of motion.  Neurological:     General: No focal deficit present.     Mental Status: He is alert.    Review of Systems  Respiratory:  Negative for cough and shortness of breath.   Cardiovascular:  Negative for chest pain.  Gastrointestinal:  Negative for abdominal pain, constipation, diarrhea, nausea and vomiting.  Neurological:  Negative for dizziness, weakness and  headaches.  Psychiatric/Behavioral:  Negative for depression, hallucinations and suicidal ideas. The patient is not nervous/anxious.    Blood pressure (!) 128/94, pulse 98, temperature 97.8 F (  36.6 C), temperature source Oral, resp. rate 16, height 5' 11 (1.803 m), weight 87.1 kg, SpO2 99%. Body mass index is 26.78 kg/m.   Treatment Plan Summary: Daily contact with patient to assess and evaluate symptoms and progress in treatment and Medication management  Jon Branch is a 23 yr old male with prior psychiatric diagnoses significant for major depressive disorder ordered recurrent, severe, with psychosis, schizophreniform disorder, cannabis use, substance-induced mood disorder. Patient presented involuntarily to [facility] from [facility] health ED at [facility] for failure to take his psychotropic medications with mental health decompensation.    Jon Branch to his medications.  We will not make any changes to his medications at this time.  If he continues to do Branch we will plan for discharge tomorrow.  We will continue to monitor.    MDD (major depressive disorder), Recurrent, Severe, w/Psychosis: --Continue risperidone  1.5 mg tablet p.o. twice daily for psychosis --Continue Cogentin  0.5 mg tablet p.o. twice daily for EPS prevention --Continue hydroxyzine  tablet 25 mg p.o. 3 times daily as needed for anxiety    Continue [facility] agitation protocol as recommended    Medications for other medical conditions: -- Continue Propranolol  tablets 10 mg p.o. twice daily for hypertension -- Continue Protonix  EC tablet 40 mg p.o. daily for GERD    Other PRN Medications  -Acetaminophen  650 mg every 6 as needed/mild pain  -Maalox 30 mL oral every 4 as needed/digestion  -Magnesium  hydroxide 30 mL daily as needed/mild constipation    --The risks/benefits/side-effects/alternatives to this medication were discussed in detail with the patient and time was  given for questions. The patient consents to medication trial.   -- Metabolic profile and EKG monitoring obtained while on an atypical antipsychotic (BMI: Lipid Panel: HbgA1c: QTc:)   -- Encouraged patient to participate in unit milieu and in scheduled group therapies     Safety and Monitoring:  Voluntary admission to inpatient psychiatric unit for safety, stabilization and treatment  Daily contact with patient to assess and evaluate symptoms and progress in treatment  Patient's case to be discussed in multi-disciplinary team meeting  Observation Level : q15 minute checks  Vital signs: q12 hours  Precautions: suicide, but pt currently verbally contracts for safety on unit?     Discharge Planning:  Social work and case management to assist with discharge planning and identification of hospital follow-up needs prior to discharge  Estimated LOS: 5-7?days  Discharge Concerns: Need to establish Safety plan; Medication compliance and effectiveness  Discharge Goals: Return home with outpatient referrals for mental health follow-up including medication management/psychotherapy.      Marsa GORMAN Rosser, DO 08/08/2024, 2:15 PM

## 2024-08-08 NOTE — Plan of Care (Signed)
" °  Problem: Education: Goal: Knowledge of [facility] Education information/materials will improve Outcome: Progressing Goal: Emotional status will improve Outcome: Progressing Goal: Mental status will improve Outcome: Progressing   Problem: Activity: Goal: Interest or engagement in activities will improve Outcome: Not Progressing   "

## 2024-08-08 NOTE — Group Note (Signed)
 Date:  08/08/2024 Time:  10:52 AM  Group Topic/Focus: Social wellness  This group focused on the concept of control--gaining it, losing it, and sharing it. The patients sat in a circle with a blank piece of paper and were instructed to draw something on it. A timer was set for one minute, and when it went off, patients were to pass their papers to the left. They were encouraged to add to the drawing, whether it related to the original theme or not. At the end of the activity, patients shared their drawings, reflected on what was added, and discussed their experiences together. The group then explored how it felt to share control and consider different perspectives. This activity promoted creative thinking and fostered positive social interactions.    Participation Level:  Did Not Attend  Participation Quality:  N/A  Affect:  N/A  Cognitive:  N/A  Insight: None  Engagement in Group:  None  Modes of Intervention:  N/A  Additional Comments:  Pt did not attend this group  Kristi HERO [facility] 08/08/2024, 10:52 AM

## 2024-08-08 NOTE — Progress Notes (Signed)
(  Sleep Hours) - 10 (Any PRNs that were needed, meds refused, or side effects to meds)- none  (Any disturbances and when (visitation, over night)- none (Concerns raised by the patient)- none  (SI/HI/AVH)- denies

## 2024-08-09 DIAGNOSIS — F333 Major depressive disorder, recurrent, severe with psychotic symptoms: Principal | ICD-10-CM

## 2024-08-09 MED ORDER — PROPRANOLOL HCL 10 MG PO TABS: 10.0000 mg | ORAL_TABLET | Freq: Two times a day (BID) | ORAL | 0 refills | Status: AC

## 2024-08-09 MED ORDER — PANTOPRAZOLE SODIUM 40 MG PO TBEC: 40.0000 mg | DELAYED_RELEASE_TABLET | Freq: Every day | ORAL | 0 refills | Status: AC

## 2024-08-09 MED ORDER — BENZTROPINE MESYLATE 0.5 MG PO TABS: 0.5000 mg | ORAL_TABLET | Freq: Two times a day (BID) | ORAL | 0 refills | Status: AC

## 2024-08-09 MED ORDER — RISPERIDONE 3 MG PO TABS: 1.5000 mg | ORAL_TABLET | Freq: Two times a day (BID) | ORAL | 0 refills | Status: AC

## 2024-08-09 NOTE — Discharge Summary (Signed)
 " Physician Discharge Summary Note  Patient:  Jon Branch is an 23 y.o., male MRN:  [id number] DOB:  2001/06/16 Patient phone:  [phone] (home)  Patient address:   [address] [place] [id number],  Total Time spent with patient: 20 minutes  Date of Admission:  08/04/2024 Date of Discharge: 08/09/2024  Reason for Admission:   During this evaluation, patient reports, I am here to get my medication restarted because of not taking it in the past one week.  He reports family sent him here for no reason.  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  Stated I had injury to my left knee & quit driving the truck. Jon Branch reports he lives with his parents and confirms the information in the IVC that he has not been taking his medications and has not been eating. He reports his medications don't make him feel good. He states he hasn't been eating because he feels like he shouldn't eat as much because its not healthy. He answers questions with random statements. He states he had a provider for medication management but does not remember who they are. He denies SI/HI, NSSIB and AVH at this time. He states he stopped taking his medications a few days ago. He denies history of substance abuse. He denies any depressive symptoms outside of his poor hygiene and decreased appetite.   Principal Problem: MDD (major depressive disorder), recurrent, severe, with psychosis (HCC) Discharge Diagnoses: Principal Problem:   MDD (major depressive disorder), recurrent, severe, with psychosis (HCC)   Past Psychiatric History:  Major depressive disorder, recurrent, with psychotic features Prior inpatient treatment: Patient report yes 2 years ago, however does not know where he was admitted. Current/prior outpatient treatment: Denies Prior rehab hx: Denies Psychotherapy hx: Yes History of suicide: Denies History of homicide or aggression:  Denies Psychiatric medication history: Denies Psychiatric medication compliance history: Noncompliance Neuromodulation history: Denies Current Psychiatrist: Reports he has a psychiatrist but does not know who Current therapist: Denies  Past Medical History:  Past Medical History:  Diagnosis Date   GERD (gastroesophageal reflux disease)    Hypertension     Past Surgical History:  Procedure Laterality Date   EYE SURGERY     Family History:  Family History  Problem Relation Age of Onset   High blood pressure Mother    Diabetes Mother    High blood pressure Father    High blood pressure Maternal Grandmother    Diabetes Maternal Grandmother    High blood pressure Paternal Grandmother    Colon cancer Neg Hx    Family Psychiatric  History:  Psych: Patient unsure Psych Rx: Patient unsure SA/HA: Patient unsure Substance use family hx: Patient unsure  Social History:  Social History   Substance and Sexual Activity  Alcohol Use Never     Social History   Substance and Sexual Activity  Drug Use Not Currently   Types: Marijuana    Social History   Socioeconomic History   Marital status: Single    Spouse name: Not on file   Number of children: Not on file   Years of education: Not on file   Highest education level: Not on file  Occupational History   Not on file  Tobacco Use   Smoking status: Former    Types: Cigarettes   Smokeless tobacco: Never  Vaping Use   Vaping status: Former   Substances: Flavoring  Substance and Sexual  Activity   Alcohol use: Never   Drug use: Not Currently    Types: Marijuana   Sexual activity: Not Currently  Other Topics Concern   Not on file  Social History Narrative   ** Merged History Encounter **       Social Drivers of Health   Tobacco Use: Medium Risk (08/04/2024)   Patient History    Smoking Tobacco Use: Former    Smokeless Tobacco Use: Never    Passive Exposure: Not on Actuary Strain: Not on file   Food Insecurity: No Food Insecurity (08/04/2024)   Epic    Worried About Programme Researcher, Broadcasting/film/video in the Last Year: Never true    Ran Out of Food in the Last Year: Never true  Transportation Needs: No Transportation Needs (08/04/2024)   Epic    Lack of Transportation (Medical): No    Lack of Transportation (Non-Medical): No  Physical Activity: Not on file  Stress: Not on file  Social Connections: Not on file  Depression (EYV7-0): Not on file  Alcohol Screen: Low Risk (08/04/2024)   Alcohol Screen    Last Alcohol Screening Score (AUDIT): 0  Housing: Low Risk (08/04/2024)   Epic    Unable to Pay for Housing in the Last Year: No    Number of Times Moved in the Last Year: 0    Homeless in the Last Year: No  Utilities: Not At Risk (08/04/2024)   Epic    Threatened with loss of utilities: No  Health Literacy: Not on file    Hospital Course:   During the patient's hospitalization, patient had extensive initial psychiatric evaluation, and follow-up psychiatric evaluations every day.  Psychiatric diagnoses provided upon initial assessment:  MDD (major depressive disorder), recurrent, severe, with psychosis (HCC)   Patient's psychiatric medications were adjusted on admission: Continued Risperdal  and Cogentin  that had been restarted. Started Propanolol.   During the hospitalization, other adjustments were made to the patient's psychiatric medication regimen: None  Patient's care was discussed during the interdisciplinary team meeting every day during the hospitalization.  The patient is not having side effects to prescribed psychiatric medication.  Gradually, patient started adjusting to milieu. The patient was evaluated each day by a clinical provider to ascertain response to treatment. Improvement was noted by the patient's report of decreasing symptoms, improved sleep and appetite, affect, medication tolerance, behavior, and participation in unit programming.  Patient was asked each day  to complete a self inventory noting mood, mental status, pain, new symptoms, anxiety and concerns.   Symptoms were reported as significantly decreased or resolved completely by discharge.  The patient reports that their mood is stable.  The patient denied having suicidal thoughts for more than 48 hours prior to discharge.  Patient denies having homicidal thoughts.  Patient denies having auditory hallucinations.  Patient denies any visual hallucinations or other symptoms of psychosis.  The patient was motivated to continue taking medication with a goal of continued improvement in mental health.   The patient reports their target psychiatric symptoms of depression and lack of self care responded well to the psychiatric medications, and the patient reports overall benefit other psychiatric hospitalization. Supportive psychotherapy was provided to the patient. The patient also participated in regular group therapy while hospitalized. Coping skills, problem solving as well as relaxation therapies were also part of the unit programming.  Labs were reviewed with the patient, and abnormal results were discussed with the patient.  The patient is able to verbalize their  individual safety plan to this provider.  # It is recommended to the patient to continue psychiatric medications as prescribed, after discharge from the hospital.    # It is recommended to the patient to follow up with your outpatient psychiatric provider and PCP.  # It was discussed with the patient, the impact of alcohol, drugs, tobacco have been there overall psychiatric and medical wellbeing, and total abstinence from substance use was recommended the patient.ed.  # Prescriptions provided or sent directly to preferred pharmacy at discharge. Patient agreeable to plan. Given opportunity to ask questions. Appears to feel comfortable with discharge.    # In the event of worsening symptoms, the patient is instructed to call the crisis  hotline, 911 and or go to the nearest ED for appropriate evaluation and treatment of symptoms. To follow-up with primary care provider for other medical issues, concerns and or health care needs  # Patient was discharged home with a plan to follow up as noted below.    On day of discharge he reports feeling well.  He reports no side effects to his medications.  He reports his sleep is good.  He reports his appetite is doing good.  He reports no SI, HI, or AVH.  Discussed with him the importance of taking his medications as prescribed and attending his follow up appointments and he reported understanding.  Discussed with him what to do in the event of a future crisis.  Discussed that he can go to [facility], go to the nearest ED, or call 911 or 988.   He reported understanding and had no concerns.  He was discharged home with his father.   Physical Findings: AIMS: Facial and Oral Movements Muscles of Facial Expression: None Lips and Perioral Area: None Jaw: None Tongue: None,Extremity Movements Upper (arms, wrists, hands, fingers): None Lower (legs, knees, ankles, toes): None, Trunk Movements Neck, shoulders, hips: None, Global Judgements Severity of abnormal movements overall : None Incapacitation due to abnormal movements: None Patient's awareness of abnormal movements: No Awareness,  ,  , AIMS Total Score AIMS Total Score: 0 CIWA:    COWS:     Musculoskeletal: Strength & Muscle Tone: within normal limits Gait & Station: normal Patient leans: N/A   Psychiatric Specialty Exam:  Presentation  General Appearance:  Appropriate for Environment; Casual  Eye Contact: Good  Speech: Clear and Coherent; Normal Rate  Speech Volume: Normal  Handedness: Right   Mood and Affect  Mood: Euthymic  Affect: Congruent; Appropriate   Thought Process  Thought Processes: Coherent; Goal Directed  Descriptions of Associations:Intact  Orientation:Full (Time, Place and  Person)  Thought Content:Logical; WDL  History of Schizophrenia/Schizoaffective disorder:Yes  Duration of Psychotic Symptoms:Less than six months  Hallucinations:Hallucinations: None  Ideas of Reference:None  Suicidal Thoughts:Suicidal Thoughts: No  Homicidal Thoughts:Homicidal Thoughts: No   Sensorium  Memory: Immediate Good; Recent Good  Judgment: Fair  Insight: Fair   Art Therapist  Concentration: Good  Attention Span: Good  Recall: Good  Fund of Knowledge: Good  Language: Good   Psychomotor Activity  Psychomotor Activity: Psychomotor Activity: Normal   Assets  Assets: Communication Skills; Desire for Improvement; Physical Health; Resilience; Social Support; Housing   Sleep  Sleep: Sleep: Good  Estimated Sleeping Duration (Last 24 Hours): 7.50-8.50 hours   Physical Exam: Physical Exam Vitals and nursing note reviewed.  Constitutional:      General: He is not in acute distress.    Appearance: Normal appearance. He is normal weight. He is not  ill-appearing or toxic-appearing.  HENT:     Head: Normocephalic and atraumatic.  Pulmonary:     Effort: Pulmonary effort is normal.  Musculoskeletal:        General: Normal range of motion.  Neurological:     General: No focal deficit present.     Mental Status: He is alert.    Review of Systems  Respiratory:  Negative for cough and shortness of breath.   Cardiovascular:  Negative for chest pain.  Gastrointestinal:  Negative for abdominal pain, constipation, diarrhea, nausea and vomiting.  Neurological:  Negative for dizziness, weakness and headaches.  Psychiatric/Behavioral:  Negative for depression, hallucinations and suicidal ideas. The patient is not nervous/anxious.    Blood pressure 131/83, pulse 97, temperature 98 F (36.7 C), temperature source Oral, resp. rate 16, height 5' 11 (1.803 m), weight 87.1 kg, SpO2 99%. Body mass index is 26.78 kg/m.   Tobacco Use  History[1] Tobacco Cessation:  N/A, patient does not currently use tobacco products   Blood Alcohol level:  Lab Results  Component Value Date   [facility] <15 08/03/2024   ETH <10 07/23/2022    Metabolic Disorder Labs:  Lab Results  Component Value Date   HGBA1C 5.2 08/05/2024   MPG 102.54 08/05/2024   No results found for: PROLACTIN Lab Results  Component Value Date   CHOL 98 08/05/2024   TRIG 51 08/05/2024   HDL 38 (L) 08/05/2024   CHOLHDL 2.6 08/05/2024   VLDL 10 08/05/2024   LDLCALC 50 08/05/2024    See Psychiatric Specialty Exam and Suicide Risk Assessment completed by Attending Physician prior to discharge.  Discharge destination:  Home  Is patient on multiple antipsychotic therapies at discharge:  No   Has Patient had three or more failed trials of antipsychotic monotherapy by history:  No  Recommended Plan for Multiple Antipsychotic Therapies: NA  Discharge Instructions     Increase activity slowly   Complete by: As directed       Allergies as of 08/09/2024       Reactions   Losartan Swelling   Porcine (pork) Protein-containing Drug Products         Medication List     TAKE these medications      Indication  benztropine  0.5 MG tablet Commonly known as: COGENTIN  Take 1 tablet (0.5 mg total) by mouth 2 (two) times daily. What changed: additional instructions The timing of this medication is very important.  Indication: Extrapyramidal Reaction caused by Medications   pantoprazole  40 MG tablet Commonly known as: PROTONIX  Take 1 tablet (40 mg total) by mouth daily.  Indication: Heartburn   propranolol  10 MG tablet Commonly known as: INDERAL  Take 1 tablet (10 mg total) by mouth 2 (two) times daily.  Indication: Feeling Anxious   risperiDONE  3 MG tablet Commonly known as: RISPERDAL  Take 0.5 tablets (1.5 mg total) by mouth 2 (two) times daily. What changed:  how much to take additional instructions  Indication: Mood control         Follow-up Information     [facility] Follow up on 08/14/2024.   Why: You have a hospital follow up appointment for therapy and medication management services on 08/14/24 at 2:30 pm  .  The appointment will be Virtual, telehealth. Contact information: [address]  [address] [place] [address] [phone]                 Follow-up recommendations/Comments:   Activity: as tolerated   Diet: heart healthy   Other: -  Follow-up with your outpatient psychiatric provider -instructions on appointment date, time, and address (location) are provided to you in discharge paperwork.   -Take your psychiatric medications as prescribed at discharge - instructions are provided to you in the discharge paperwork   -Follow-up with outpatient primary care doctor and other specialists -for management of chronic medical disease, including: Routine Care.   -Testing: Follow-up with outpatient provider for abnormal lab results: None   -Recommend abstinence from alcohol, tobacco, and other illicit drug use at discharge.    -If your psychiatric symptoms recur, worsen, or if you have side effects to your psychiatric medications, call your outpatient psychiatric provider, 911, [phone] or go to the nearest emergency department.   -If suicidal thoughts recur, call your outpatient psychiatric provider, 911, 988 or go to the nearest emergency department.  Signed: Marsa GORMAN Rosser, DO 08/09/2024, 9:01 AM           [1]  Social History Tobacco Use  Smoking Status Former   Types: Cigarettes  Smokeless Tobacco Never   "

## 2024-08-09 NOTE — Plan of Care (Signed)

## 2024-08-09 NOTE — Group Note (Signed)
 Date:  08/09/2024 Time:  10:51 AM  Group Topic/Focus:  Goals Group:   The focus of this group is to help patients establish daily goals to achieve during treatment and discuss how the patient can incorporate goal setting into their daily lives to aide in recovery.    Participation Level:  Did Not Attend   Jon Branch 08/09/2024, 10:51 AM

## 2024-08-09 NOTE — Progress Notes (Addendum)
 Patient discharged from [facility] on 08/09/24 at 11:30am. Pt refused to complete suicide safety plan.  Patient is alert, oriented. RN provided patient with discharge paperwork and reviewed information with patient. Patient expressed that he understood all of the discharge instructions. Pt was satisfied with belongings returned to him from the locker and at bedside. Discharged patient to [facility] waiting room. Pt's father awaiting patient in the [facility] waiting room.

## 2024-08-09 NOTE — Progress Notes (Signed)
" °   08/09/24 0800  Psych Admission Type (Psych Patients Only)  Admission Status Voluntary  Psychosocial Assessment  Patient Complaints None  Eye Contact Fair  Facial Expression Flat  Affect Flat  Speech Slow;Soft  Interaction Assertive  Motor Activity Slow  Appearance/Hygiene Unremarkable  Behavior Characteristics Cooperative  Mood Depressed  Thought Process  Coherency WDL  Content WDL  Delusions None reported or observed  Perception WDL  Hallucination None reported or observed  Judgment Impaired  Confusion None  Danger to Self  Current suicidal ideation? Denies  Agreement Not to Harm Self Yes  Description of Agreement Verbal  Danger to Others  Danger to Others None reported or observed    "

## 2024-08-09 NOTE — Progress Notes (Signed)
" °  [facility] Adult Case Management Discharge Plan :  Will you be returning to the same living situation after discharge:  Yes At discharge, do you have transportation home?: Yes,  Patient father will pick the patient up Do you have the ability to pay for your medications: Yes,  [employer]AN / [employer]  Release of information consent forms completed and in the chart;  Patient's signature needed at discharge.  Patient to Follow up at:  Follow-up Information     [facility] Follow up on 08/14/2024.   Why: You have a hospital follow up appointment for therapy and medication management services on 08/14/24 at 2:30 pm  .  The appointment will be Virtual, telehealth. Contact information: [address]  [address] [place] [address] [phone]                 Next level of care provider has access to [facility] Link:no  Safety Planning and Suicide Prevention discussed: Yes,  Jenkins Primer, LCSWA completed completed with Adine Donovan, the patient's dad      Has patient been referred to the Quitline?: Patient denied use of alcohol/tobacco.   Patient has been referred for addiction treatment: No known substance use disorder.  Gaylan Homans, LCSWA 08/09/2024, 9:30 AM "
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
## 2024-08-21 NOTE — ED Provider Notes (Signed)
 " [facility] EM[facility] AT [facility] Provider Note   CSN: [id number] Arrival date & time: 07/25/24  1943     Patient presents with: Back Pain and Constipation   [facility] is a 24 y.o. male.   24 year old male with hx of HTN, GERD presents to the emergency department for evaluation of abdominal pain and back pain.  States that he has been constipated with his symptoms.  No documented fevers, but reports in triage that he feels like he has the flu.  Denies vomiting, diarrhea, melena, hematochezia.  No prior abdominal surgeries.  The history is provided by the patient. No language interpreter was used.  Back Pain Constipation Associated symptoms: back pain        Prior to Admission medications  Medication Sig Start Date End Date Taking? Authorizing Provider  benztropine  (COGENTIN ) 0.5 MG tablet Take 1 tablet (0.5 mg total) by mouth 2 (two) times daily. 08/09/24   Raliegh Marsa RAMAN, DO  pantoprazole  (PROTONIX ) 40 MG tablet Take 1 tablet (40 mg total) by mouth daily. 08/09/24   Raliegh Marsa RAMAN, DO  propranolol  (INDERAL ) 10 MG tablet Take 1 tablet (10 mg total) by mouth 2 (two) times daily. 08/09/24   Raliegh Marsa RAMAN, DO  risperiDONE  (RISPERDAL ) 3 MG tablet Take 0.5 tablets (1.5 mg total) by mouth 2 (two) times daily. 08/09/24   Raliegh Marsa RAMAN, DO    Allergies: Losartan and Porcine (pork) protein-containing drug products    Review of Systems  Gastrointestinal:  Positive for constipation.  Musculoskeletal:  Positive for back pain.  Ten systems reviewed and are negative for acute change, except as noted in the HPI.    Updated Vital Signs BP (!) 139/97 (BP Location: Right Arm)   Pulse (!) 107   Temp 98.6 F (37 C) (Oral)   Resp 16   SpO2 100%   Physical Exam Vitals and nursing note reviewed.  Constitutional:      General: He is not in acute distress.    Appearance: He is well-developed. He is not diaphoretic.     Comments:  Nontoxic appearing and in NAD  HENT:     Head: Normocephalic and atraumatic.  Eyes:     General: No scleral icterus.    Conjunctiva/sclera: Conjunctivae normal.  Pulmonary:     Effort: Pulmonary effort is normal. No respiratory distress.     Comments: Respirations even and unlabored Abdominal:     Palpations: Abdomen is soft.     Comments: Nondistended. No peritoneal signs.  Musculoskeletal:        General: Normal range of motion.     Cervical back: Normal range of motion.  Skin:    General: Skin is warm and dry.     Coloration: Skin is not pale.     Findings: No erythema or rash.  Neurological:     Mental Status: He is alert and oriented to person, place, and time.  Psychiatric:        Behavior: Behavior normal.     (all labs ordered are listed, but only abnormal results are displayed) Labs Reviewed  CBC WITH DIFFERENTIAL/PLATELET - Abnormal; Notable for the following components:      Result Value   WBC 17.8 (*)    Platelets 433 (*)    Neutro Abs 14.8 (*)    Monocytes Absolute 1.1 (*)    All other components within normal limits  COMPREHENSIVE METABOLIC PANEL WITH GFR - Abnormal; Notable for the following components:   CO2  21 (*)    Glucose, Bld 114 (*)    Total Protein 8.6 (*)    All other components within normal limits  URINALYSIS, ROUTINE W REFLEX MICROSCOPIC - Abnormal; Notable for the following components:   Color, Urine AMBER (*)    APPearance HAZY (*)    Specific Gravity, Urine 1.040 (*)    Hgb urine dipstick SMALL (*)    Bilirubin Urine SMALL (*)    Ketones, ur 20 (*)    Protein, ur >=300 (*)    Bacteria, UA RARE (*)    All other components within normal limits  RESP PANEL BY RT-PCR (RSV, FLU A&B, COVID)  [id number]    EKG: None  Radiology: CT Renal Stone Study Result Date: 07/26/2024 EXAM: CT UROGRAM 07/26/2024 12:40:24 AM TECHNIQUE: CT of the abdomen and pelvis was performed without the administration of intravenous contrast. Multiplanar reformatted  images as well as MIP urogram images are provided for review. Automated exposure control, iterative reconstruction, and/or weight based adjustment of the mA/kV was utilized to reduce the radiation dose to as low as reasonably achievable. COMPARISON: None available. CLINICAL HISTORY: Abdominal/flank pain, stone suspected. FINDINGS: LOWER CHEST: No acute abnormality. LIVER: The liver is unremarkable. GALLBLADDER AND BILE DUCTS: Gallbladder is unremarkable. No biliary ductal dilatation. SPLEEN: No acute abnormality. PANCREAS: No acute abnormality. ADRENAL GLANDS: No acute abnormality. KIDNEYS, URETERS AND BLADDER: No stones in the kidneys or ureters. No hydronephrosis. No perinephric or periureteral stranding. Urinary bladder is unremarkable. GI AND BOWEL: Small stool burden. The stomach, small bowel, and large bowel are otherwise unremarkable. The appendix is normal. There is no bowel obstruction. PERITONEUM AND RETROPERITONEUM: No ascites. No free air. VASCULATURE: Aorta is normal in caliber. LYMPH NODES: No lymphadenopathy. REPRODUCTIVE ORGANS: No acute abnormality. BONES AND SOFT TISSUES: No acute osseous abnormality. No focal soft tissue abnormality. IMPRESSION: 1. No acute intra-abdominal or pelvic abnormality. Electronically signed by: Dorethia Molt MD 07/26/2024 12:53 AM EST RP Workstation: [id number]      Procedures   Medications Ordered in the ED  lactated ringers  bolus 1,000 mL (0 mLs Intravenous Stopped 07/26/24 0305)  ketorolac  (TORADOL ) 15 MG/ML injection 15 mg (15 mg Intravenous Given 07/26/24 0050)                                    Medical Decision Making Amount and/or Complexity of Data Reviewed Radiology: ordered.  Risk Prescription drug management.   This patient presents to the ED for concern of abdominal pain/flank pain and constipation, this involves an extensive number of treatment options, and is a complaint that carries with it a high risk of complications and morbidity.   The differential diagnosis includes constipation vs pSBO/SBO vs volvusus vs viral illness vs kidney stone vs UTI/pyelo vs diverticulitis vs appendicitis vs MSK.   Co morbidities that complicate the patient evaluation  HTN   Additional history obtained:  Additional history obtained from medical records External records from outside source obtained and reviewed including prior CBC results for trending.   Lab Tests:  I Ordered, and personally interpreted labs.  The pertinent results include:  WBC 17.8 (leukocytosis at baseline, but typically ~11). CO2 21. UA suggests dehydration with increased SG, mild ketonuria, proteinuria; 11-20 RBCs on urine microscopic.   Imaging Studies ordered:  I ordered imaging studies including CT renal study  I independently visualized and interpreted imaging which showed no acute abnormality in the abdomen/pelvis. I agree with  the radiologist interpretation   Cardiac Monitoring:  The patient was maintained on a cardiac monitor.  I personally viewed and interpreted the cardiac monitored which showed an underlying rhythm of: sinus tachycardia, improving.   Medicines ordered and prescription drug management:  I ordered medication including Toradol  and IVF for pain, rehydration  Reevaluation of the patient after these medicines showed that the patient improved I have reviewed the patients home medicines and have made adjustments as needed   Test Considered:  CK   Problem List / ED Course:  Patient complaining of abdominal pain and back pain with associated constipation. Noted to have leukocytosis which is elevated from baseline.  Also tachycardic in triage, but improving. No electrolyte derangements.  Liver and kidney function preserved.  Urinalysis not concerning for urinary tract infection, though is suggestive of dehydration.  IV fluids given. Viral panel is negative. CT ordered for further evaluation given SIRS criteria, complaint of  abdominal pain.  This is negative for acute process including bowel obstruction, appendicitis, diverticulitis, intraabdominal abscess, and ruptured viscous.  Symptoms improving with IV fluids, Toradol .  Afebrile in the emergency department, overall well-appearing.  Do not feel further emergent workup is presently indicated, but have advised follow-up with a primary doctor.  Patient agreeable to plan.   Reevaluation:  After the interventions noted above, I reevaluated the patient and found that they have :improved   Social Determinants of Health:  Language barrier   Dispostion:  After consideration of the diagnostic results and the patients response to treatment, I feel that the patent would benefit from supportive care treatment and PCP f/u. Return precautions discussed and provided. Patient discharged in stable condition with no unaddressed concerns.       Final diagnoses:  Acute left-sided low back pain with left-sided sciatica    ED Discharge Orders          Ordered    naproxen  (NAPROSYN ) 500 MG tablet  Every 12 hours PRN,   Status:  Discontinued        07/26/24 0254    methocarbamol  (ROBAXIN ) 500 MG tablet  Every 12 hours PRN,   Status:  Discontinued        07/26/24 0254               Keith Sor, PA-C 08/21/24 2314    Cardama, Raynell Moder, MD 08/27/24 2256  "
